# Patient Record
Sex: Male | Born: 1955 | Race: White | Hispanic: No | Marital: Married | Smoking: Former smoker
Health system: Southern US, Community
[De-identification: ages and names within clinical notes are randomized; demographics above are authoritative.]

## PROBLEM LIST (undated history)

## (undated) DIAGNOSIS — K625 Hemorrhage of anus and rectum: Secondary | ICD-10-CM

## (undated) DIAGNOSIS — K469 Unspecified abdominal hernia without obstruction or gangrene: Secondary | ICD-10-CM

## (undated) DIAGNOSIS — K589 Irritable bowel syndrome without diarrhea: Secondary | ICD-10-CM

## (undated) DIAGNOSIS — I2699 Other pulmonary embolism without acute cor pulmonale: Secondary | ICD-10-CM

## (undated) HISTORY — PX: HERNIA REPAIR: SHX51

## (undated) HISTORY — DX: Hemorrhage of anus and rectum: K62.5

## (undated) HISTORY — DX: Unspecified abdominal hernia without obstruction or gangrene: K46.9

---

## 2010-11-15 ENCOUNTER — Ambulatory Visit (INDEPENDENT_AMBULATORY_CARE_PROVIDER_SITE_OTHER): Payer: Self-pay | Admitting: General Surgery

## 2010-12-07 ENCOUNTER — Ambulatory Visit (INDEPENDENT_AMBULATORY_CARE_PROVIDER_SITE_OTHER): Payer: PRIVATE HEALTH INSURANCE | Admitting: General Surgery

## 2010-12-07 ENCOUNTER — Encounter (INDEPENDENT_AMBULATORY_CARE_PROVIDER_SITE_OTHER): Payer: Self-pay | Admitting: General Surgery

## 2010-12-07 VITALS — BP 126/80 | HR 60 | Temp 97.4°F | Ht 70.0 in | Wt 187.8 lb

## 2010-12-07 DIAGNOSIS — K4091 Unilateral inguinal hernia, without obstruction or gangrene, recurrent: Secondary | ICD-10-CM

## 2010-12-07 NOTE — Progress Notes (Signed)
Ian West is a 55 y.o. male.    Chief Complaint  Patient presents with  . Other    eval of lih    HPI HPI This patient is referred Dr. freed for evaluation of a left groin bulge which he noticed approximately 9 months ago. He has a history of a right inguinal hernia which he states was repaired approximately 20 years ago due to incarceration. He has done well from this procedure but several months ago noticed some swelling on the left side. He denies any pain in the region although he describes it as "an annoyance". He states that he has this discomfort and swelling when he is on his feet for a prolonged period of time. He states his bowels are normal has no associated nausea vomiting no associated symptoms and denies any obstructive symptoms.He does report having a colonoscopy which is up-to-date which was without significant abnormality.  Past Medical History  Diagnosis Date  . Hernia   . Rectal bleeding     hemorrhoids    History reviewed. No pertinent past surgical history.  Family History  Problem Relation Age of Onset  . Thyroid disease Mother   . Diabetes Father     Social History History  Substance Use Topics  . Smoking status: Former Smoker    Quit date: 08/11/1993  . Smokeless tobacco: Not on file  . Alcohol Use: Yes     2 drinks per week    Allergies  Allergen Reactions  . Pollen Extract-Tree Extract Itching    Seasonal allergy. Includes grass.    No current outpatient prescriptions on file.    Review of Systems Review of Systems  Constitutional: Negative.   HENT: Negative.   Eyes: Negative.   Respiratory: Negative.   Cardiovascular: Negative.   Gastrointestinal: Positive for blood in stool.  Genitourinary: Negative.   Musculoskeletal: Negative.   Skin: Negative.   Neurological: Negative.   Endo/Heme/Allergies: Negative.   Psychiatric/Behavioral: Negative.     Physical Exam Physical Exam  Vitals reviewed. Constitutional: He is oriented to  person, place, and time. He appears well-developed and well-nourished. No distress.  HENT:  Head: Normocephalic and atraumatic.  Mouth/Throat: No oropharyngeal exudate.  Eyes: Conjunctivae and EOM are normal. Pupils are equal, round, and reactive to light. Right eye exhibits no discharge. Left eye exhibits no discharge. No scleral icterus.  Neck: No tracheal deviation present.  Cardiovascular: Normal rate, regular rhythm and normal heart sounds.   Respiratory: Effort normal and breath sounds normal. No stridor. No respiratory distress. He has no wheezes.  GI: Soft. Bowel sounds are normal. He exhibits no distension and no mass. There is no tenderness. There is no rebound and no guarding.  Genitourinary:       Reducible LIH, whss on the right  Musculoskeletal: Normal range of motion. He exhibits no tenderness.  Neurological: He is alert and oriented to person, place, and time.  Skin: Skin is warm and dry. No rash noted. He is not diaphoretic. No erythema. No pallor.  Psychiatric: He has a normal mood and affect. His behavior is normal. Judgment and thought content normal.     Blood pressure 126/80, pulse 60, temperature 97.4 F (36.3 C), temperature source Temporal, height 5\' 10"  (1.778 m), weight 187 lb 12.8 oz (85.186 kg).  Assessment/Plan He does have a moderate-sized, reducible, left inguinal hernia on exam and by history. I do not appreciate any recurrent right inguinal hernia. He is minimally symptomatic from this but I would recommend repair  on elective basis as he is having some symptoms and it is likely to increase in size. Also given his history of incarceration on the contralateral side I think it it better to perform elective repair. I discussed with him the options of laparoscopic versus an open hernia repair and he is considering both. He is concerned about the finances and the cost of each repair and he is leaning towards open repair if this will be most cost effective for him. We  will plan for open repair and if he changes his mind we will be happy to offer him laparoscopic left inguinal hernia repair. I discussed with him the risks of infection, bleeding, pain, scarring, nerve injury and chronic pain, loss of testicle, and injury to the vas deferens and he expressed understanding and desires to proceed with left inguinal hernia repair  Ian West Ian West 12/07/2010, 1:24 PM

## 2016-04-25 ENCOUNTER — Emergency Department (HOSPITAL_COMMUNITY): Payer: BLUE CROSS/BLUE SHIELD

## 2016-04-25 ENCOUNTER — Inpatient Hospital Stay (HOSPITAL_COMMUNITY)
Admission: EM | Admit: 2016-04-25 | Discharge: 2016-04-28 | DRG: 175 | Disposition: A | Discharge status: Home / Self Care | Payer: BLUE CROSS/BLUE SHIELD | Attending: Internal Medicine | Admitting: Internal Medicine

## 2016-04-25 ENCOUNTER — Encounter (HOSPITAL_COMMUNITY): Payer: Self-pay

## 2016-04-25 ENCOUNTER — Inpatient Hospital Stay (HOSPITAL_COMMUNITY): Payer: BLUE CROSS/BLUE SHIELD

## 2016-04-25 DIAGNOSIS — R578 Other shock: Secondary | ICD-10-CM | POA: Diagnosis present

## 2016-04-25 DIAGNOSIS — R52 Pain, unspecified: Secondary | ICD-10-CM

## 2016-04-25 DIAGNOSIS — I2699 Other pulmonary embolism without acute cor pulmonale: Secondary | ICD-10-CM | POA: Diagnosis not present

## 2016-04-25 DIAGNOSIS — J9601 Acute respiratory failure with hypoxia: Secondary | ICD-10-CM | POA: Diagnosis present

## 2016-04-25 DIAGNOSIS — Z87891 Personal history of nicotine dependence: Secondary | ICD-10-CM

## 2016-04-25 DIAGNOSIS — R570 Cardiogenic shock: Secondary | ICD-10-CM | POA: Diagnosis not present

## 2016-04-25 DIAGNOSIS — I82419 Acute embolism and thrombosis of unspecified femoral vein: Secondary | ICD-10-CM | POA: Diagnosis not present

## 2016-04-25 DIAGNOSIS — R0602 Shortness of breath: Secondary | ICD-10-CM | POA: Diagnosis present

## 2016-04-25 DIAGNOSIS — I2609 Other pulmonary embolism with acute cor pulmonale: Secondary | ICD-10-CM

## 2016-04-25 DIAGNOSIS — I82431 Acute embolism and thrombosis of right popliteal vein: Secondary | ICD-10-CM | POA: Diagnosis present

## 2016-04-25 LAB — CBG MONITORING, ED: GLUCOSE-CAPILLARY: 153 mg/dL — AB (ref 65–99)

## 2016-04-25 LAB — PROTIME-INR
INR: 1.92
Prothrombin Time: 22.2 seconds — ABNORMAL HIGH (ref 11.4–15.2)

## 2016-04-25 LAB — CBC
HCT: 24.7 % — ABNORMAL LOW (ref 39.0–52.0)
HEMATOCRIT: 37.3 % — AB (ref 39.0–52.0)
HEMOGLOBIN: 8.4 g/dL — AB (ref 13.0–17.0)
HEMOGLOBIN: 13 g/dL (ref 13.0–17.0)
MCH: 32.4 pg (ref 26.0–34.0)
MCH: 32.5 pg (ref 26.0–34.0)
MCHC: 34 g/dL (ref 30.0–36.0)
MCHC: 34.9 g/dL (ref 30.0–36.0)
MCV: 95.4 fL (ref 78.0–100.0)
MCV: 93.3 fL (ref 78.0–100.0)
PLATELETS: 91 10*3/uL — AB (ref 150–400)
Platelets: 191 10*3/uL (ref 150–400)
RBC: 2.59 MIL/uL — ABNORMAL LOW (ref 4.22–5.81)
RBC: 4 MIL/uL — AB (ref 4.22–5.81)
RDW: 12.4 % (ref 11.5–15.5)
RDW: 12.6 % (ref 11.5–15.5)
WBC: 12 10*3/uL — ABNORMAL HIGH (ref 4.0–10.5)
WBC: 10.2 10*3/uL (ref 4.0–10.5)

## 2016-04-25 LAB — CBC WITH DIFFERENTIAL/PLATELET
BASOS ABS: 0 10*3/uL (ref 0.0–0.1)
BASOS PCT: 0 %
Eosinophils Absolute: 0.1 10*3/uL (ref 0.0–0.7)
Eosinophils Relative: 1 %
HEMATOCRIT: 42.4 % (ref 39.0–52.0)
HEMOGLOBIN: 14.9 g/dL (ref 13.0–17.0)
LYMPHS PCT: 12 %
Lymphs Abs: 1.5 10*3/uL (ref 0.7–4.0)
MCH: 32.7 pg (ref 26.0–34.0)
MCHC: 35.1 g/dL (ref 30.0–36.0)
MCV: 93.2 fL (ref 78.0–100.0)
MONOS PCT: 8 %
Monocytes Absolute: 1 10*3/uL (ref 0.1–1.0)
NEUTROS ABS: 10.6 10*3/uL — AB (ref 1.7–7.7)
NEUTROS PCT: 79 %
Platelets: 168 10*3/uL (ref 150–400)
RBC: 4.55 MIL/uL (ref 4.22–5.81)
RDW: 12.2 % (ref 11.5–15.5)
WBC: 13.2 10*3/uL — ABNORMAL HIGH (ref 4.0–10.5)

## 2016-04-25 LAB — I-STAT TROPONIN, ED: Troponin i, poc: 0.21 ng/mL (ref 0.00–0.08)

## 2016-04-25 LAB — APTT: APTT: 72 s — AB (ref 24–36)

## 2016-04-25 LAB — BASIC METABOLIC PANEL
ANION GAP: 7 (ref 5–15)
BUN: 14 mg/dL (ref 6–20)
CHLORIDE: 108 mmol/L (ref 101–111)
CO2: 23 mmol/L (ref 22–32)
Calcium: 9 mg/dL (ref 8.9–10.3)
Creatinine, Ser: 1.14 mg/dL (ref 0.61–1.24)
GFR calc Af Amer: >60 mL/min (ref >60)
GFR calc non Af Amer: >60 mL/min (ref >60)
GLUCOSE: 114 mg/dL — AB (ref 65–99)
Potassium: 4.8 mmol/L (ref 3.5–5.1)
Sodium: 138 mmol/L (ref 135–145)

## 2016-04-25 LAB — HEPARIN LEVEL (UNFRACTIONATED)

## 2016-04-25 LAB — MRSA PCR SCREENING: MRSA BY PCR: NEGATIVE

## 2016-04-25 MED ORDER — SODIUM CHLORIDE 0.9 % IV SOLN: 250.0000 mL | INTRAVENOUS | Status: DC | PRN

## 2016-04-25 MED ORDER — IOPAMIDOL (ISOVUE-370) INJECTION 76%
INTRAVENOUS | Status: AC
  Administered 2016-04-25: 100 mL
  Filled 2016-04-25: qty 100

## 2016-04-25 MED ORDER — NOREPINEPHRINE BITARTRATE 1 MG/ML IV SOLN
2.0000 ug/min | INTRAVENOUS | Status: DC
  Administered 2016-04-25: 8 ug/min via INTRAVENOUS
  Filled 2016-04-25: qty 4

## 2016-04-25 MED ORDER — HEPARIN (PORCINE) IN NACL 100-0.45 UNIT/ML-% IJ SOLN
1450.0000 [IU]/h | INTRAMUSCULAR | Status: AC
  Administered 2016-04-25: 1300 [IU]/h via INTRAVENOUS
  Administered 2016-04-26: 1700 [IU]/h via INTRAVENOUS
  Administered 2016-04-27: 1450 [IU]/h via INTRAVENOUS
  Filled 2016-04-25 (×5): qty 250

## 2016-04-25 MED ORDER — SODIUM CHLORIDE 0.9 % IV SOLN
250.0000 mL | Freq: Once | INTRAVENOUS | Status: AC
  Administered 2016-04-25: 250 mL via INTRAVENOUS

## 2016-04-25 MED ORDER — TENECTEPLASE 50 MG IV KIT
50.0000 mg | PACK | INTRAVENOUS | Status: AC
  Administered 2016-04-25: 50 mg via INTRAVENOUS
  Filled 2016-04-25: qty 10

## 2016-04-25 MED ORDER — SODIUM CHLORIDE 0.9 % IV SOLN
INTRAVENOUS | Status: DC
  Administered 2016-04-25 – 2016-04-27 (×2): via INTRAVENOUS

## 2016-04-25 MED ORDER — ALTEPLASE (PULMONARY EMBOLISM) INFUSION
100.0000 mg | Freq: Once | INTRAVENOUS | Status: DC
  Filled 2016-04-25: qty 100

## 2016-04-25 NOTE — ED Notes (Signed)
Critical Care NP at bedside pt c/o headache that started a few minutes ago will continue to monitor

## 2016-04-25 NOTE — Progress Notes (Addendum)
ANTICOAGULATION CONSULT NOTE - Initial Consult  Pharmacy Consult for TNKase >> Heparin Indication: pulmonary embolus  Allergies  Allergen Reactions  . Pollen Extract-Tree Extract Itching    Seasonal allergy. Includes grass.    Patient Measurements: Height: 5\' 10"  (177.8 cm) Weight: 205 lb (93 kg) IBW/kg (Calculated) : 73 Heparin Dosing Weight: 91.8 kg  Vital Signs: Temp: 98.4 F (36.9 C) (01/01 1135) Temp Source: Oral (01/01 1135) BP: 133/85 (01/01 1500) Pulse Rate: 100 (01/01 1500)  Labs:  Recent Labs  04/25/16 1155 04/25/16 1437  HGB 14.9 8.4*  HCT 42.4 24.7*  PLT 168 PENDING  APTT  --  72*  LABPROT  --  22.2*  INR  --  1.92  CREATININE 1.14  --     Estimated Creatinine Clearance: 79.9 mL/min (by C-G formula based on SCr of 1.14 mg/dL).   Medical History: Past Medical History:  Diagnosis Date  . Hernia   . Rectal bleeding    hemorrhoids    Assessment: 61 yo male with PE s/p TNKase in ED given at ~1350. Pharmacy consulted to dose heparin WITHOUT bolus. Not on any anticoagulants prior to admission. Hgb/Hct and pltc wnl upon admission.  Hgb with large drop ~15>8.4 after TNKase. No bleeding per RN after transfer to floor.   First APTT <80, INR 1.92, drawn at ~1430 04/25/2016.   Goal of Therapy:  Goal heparin level 0.3-0.5 for first 24 hrs, then 0.3-0.7  Monitor platelets by anticoagulation protocol: Yes   Plan:  Start heparin infusion at 1300 units/hr WITHOUT BOLUS Check heparin level q6h for the first 24 hrs (through 04/26/16 @ 1400 ), then daily  Continue to monitor Hgb, hct, pltc 2030 heparin level  Allena Katzaroline E Welles, Pharm.D. PGY1 Pharmacy Resident 1/1/20183:28 PM Pager [phone]

## 2016-04-25 NOTE — ED Notes (Addendum)
While in pt's room pt on monitor began to have pauses and was noted to drop his BP pt also demonstrated some shaking to upper extremities and color change became very pale and diaphoretic RT to room ER MD to room critical care team paged pt remains diaphoretic and deep inspirations noted

## 2016-04-25 NOTE — ED Notes (Signed)
Went in to pt's room found pt unresponsive with agonal respirations, pt on monitor in NST 120's ER MD and RT to room

## 2016-04-25 NOTE — ED Notes (Signed)
Critical care NP at bedside pt with agonal breathing appears in distress color still appears bluish

## 2016-04-25 NOTE — Progress Notes (Signed)
*  PRELIMINARY RESULTS* Vascular Ultrasound Lower extremity venous duplex has been completed.  Preliminary findings: Hyperacute DVT noted in the right popliteal vein. The vein appears largely dilated with partial thrombus and partial rouleaux flow. Short segments of acute DVT noted in the right distal femoral vein and proximal posterior tibial veins.   Called results to Dr. Delton CoombesByrum.   Farrel DemarkJill Eunice, RDMS, RVT  04/25/2016, 3:23 PM

## 2016-04-25 NOTE — ED Triage Notes (Signed)
Pt over the past few weeks has had some intermittent chest pain related to a cough than last night woke up from sleep with pain in his R calf area then this morning when he took his leg off he pillow pt experienced sudden onset of sharp chest pain with diaphoresis initial BP was in the 80's pt now arrives with decreased chest pain and appears in no distress [place]

## 2016-04-25 NOTE — ED Notes (Signed)
Pt remains on monitor is responsive to verbal commands pads placed on pt's chest 5L [place] remains on pt has IVF infusing will continue to monitor

## 2016-04-25 NOTE — ED Notes (Addendum)
Levo 8mc started pt remains on monitor still appears in resp distress color appears cyanotic 100% NRB started by RT

## 2016-04-25 NOTE — ED Notes (Signed)
Byrum MD at bedside pushing TNK to L IV site

## 2016-04-25 NOTE — ED Notes (Signed)
Family at bedside. Pt remains on monitor still responding to verbal stimuli BP improving

## 2016-04-25 NOTE — ED Notes (Signed)
Pt now responding to verbal stimulation appears pale and diaphoretic pt remains on monitor 5L [place] ER MD and family at bedside

## 2016-04-25 NOTE — ED Provider Notes (Signed)
[facility]-EMERGENCY DEPT Provider Note   CSN: [id number] Arrival date & time: 04/25/16  1124     History   Chief Complaint Chief Complaint  Patient presents with  . Shortness of Breath    pt had an earlier episode of chest pain after moving his R leg after having pain that woke him up from his sleep     HPI Ian West is a 61 y.o. male.  HPI Ian West is a 61 y.o. male with PMH significant for hernia and hemorrhoids who presents BIB EMS on 5L oxygen via nasal cannula satting about 95% with 4-5 day history of gradual onset, constant, worsening shortness of breath.  He states he is normally an active guy but over the last couple of days he has become extremely short of breath performing daily activities such as walking up stairs, walking in general, and exercising.  Associated symptoms include mild non-productive cough.  He denies fever, chills, chest pain, syncope, hemoptysis, N/V/D.  This morning he awoke and felt like he was not able to breathe and became diaphoretic.  He also experienced severe right calf pain.  He denies swelling, erythema, or warmth.  No numbness, pallor, or weakness. He did recently fly from [facility] about 4 weeks ago.  No hx of DVT/PE, no family hx of DVT/PE, recent surgery, or immobilization.    Past Medical History:  Diagnosis Date  . Hernia   . Rectal bleeding    hemorrhoids    Patient Active Problem List   Diagnosis Date Noted  . PE (pulmonary thromboembolism) (HCC) 04/25/2016    Past Surgical History:  Procedure Laterality Date  . HERNIA REPAIR         Home Medications    Prior to Admission medications   Medication Sig Start Date End Date Taking? Authorizing Provider  acetaminophen (TYLENOL) 500 MG tablet Take 1,000 mg by mouth every 6 (six) hours as needed for moderate pain.   Yes Historical Provider, MD  acidophilus (RISAQUAD) CAPS capsule Take 1 capsule by mouth daily.   Yes Historical Provider, MD  polycarbophil (FIBERCON) 625 MG tablet Take  625 mg by mouth daily.   Yes Historical Provider, MD  Turmeric (CURCUMIN 95) 500 MG CAPS Take 500 mg by mouth daily.   Yes Historical Provider, MD    Family History Family History  Problem Relation Age of Onset  . Thyroid disease Mother   . Diabetes Father     Social History Social History  Substance Use Topics  . Smoking status: Former Smoker    Quit date: 08/11/1993  . Smokeless tobacco: Never Used  . Alcohol use Yes     Comment: 2 drinks per week     Allergies   Pollen extract-tree extract   Review of Systems Review of Systems All other systems negative unless otherwise stated in HPI   Physical Exam Updated Vital Signs BP 133/85   Pulse 100   Temp 98.4 F (36.9 C) (Oral)   Resp 16   Ht 5\' 10"  (1.778 m)   Wt 93 kg   SpO2 98%   BMI 29.41 kg/m   Physical Exam  Constitutional: He is oriented to person, place, and time. He appears well-developed and well-nourished.  Non-toxic appearance. He does not have a sickly appearance. He does not appear ill.  HENT:  Head: Normocephalic and atraumatic.  Mouth/Throat: Oropharynx is clear and moist.  Eyes: Conjunctivae are normal.  Neck: Normal range of motion. Neck supple.  Cardiovascular: Regular rhythm.  Tachycardia  present.   HR 112 on monitor  Pulmonary/Chest: Effort normal. No accessory muscle usage or stridor. No respiratory distress. He has wheezes (faint). He has rhonchi. He has no rales.  Patient on 5L [place] with satts ranging 93-97%.  When oxygen removed desatts to 90%.  Able to speak in full sentences, but seems short of breath.   Abdominal: Soft. Bowel sounds are normal. He exhibits no distension. There is no tenderness.  Musculoskeletal: Normal range of motion. He exhibits tenderness.  Right proximal calf with tenderness.  No erythema, warmth, or palpable cords. Negative Homan's sign.   Lymphadenopathy:    He has no cervical adenopathy.  Neurological: He is alert and oriented to person, place, and time.  Speech  clear without dysarthria.  Skin: Skin is warm and dry.  Psychiatric: He has a normal mood and affect. His behavior is normal.     ED Treatments / Results  Labs (all labs ordered are listed, but only abnormal results are displayed) Labs Reviewed  CBC WITH DIFFERENTIAL/PLATELET - Abnormal; Notable for the following:       Result Value   WBC 13.2 (*)    Neutro Abs 10.6 (*)    All other components within normal limits  BASIC METABOLIC PANEL - Abnormal; Notable for the following:    Glucose, Bld 114 (*)    All other components within normal limits  APTT - Abnormal; Notable for the following:    aPTT 72 (*)    All other components within normal limits  PROTIME-INR - Abnormal; Notable for the following:    Prothrombin Time 22.2 (*)    All other components within normal limits  CBC - Abnormal; Notable for the following:    WBC 12.0 (*)    RBC 2.59 (*)    Hemoglobin 8.4 (*)    HCT 24.7 (*)    All other components within normal limits  I-STAT TROPOININ, ED - Abnormal; Notable for the following:    Troponin i, poc 0.21 (*)    All other components within normal limits  CBG MONITORING, ED - Abnormal; Notable for the following:    Glucose-Capillary 153 (*)    All other components within normal limits  MRSA PCR SCREENING  HEPARIN LEVEL (UNFRACTIONATED)    EKG  EKG Interpretation  Date/Time:  Monday April 25 2016 11:31:39 EST Ventricular Rate:  107 PR Interval:    QRS Duration: 99 QT Interval:  350 QTC Calculation: 467 R Axis:   87 Text Interpretation:  Sinus tachycardia RSR' in V1 or V2, right VCD or RVH Confirmed by BELFI  MD, MELANIE ([id number]) on 04/25/2016 12:21:34 PM       Radiology Dg Chest 2 View  Result Date: 04/25/2016 CLINICAL DATA:  Pt c/o SOB, central chest discomfort and tightness, with coldness and clamminess this morning. Pt states he also had pain in the back of his right leg earlier today. Pt reports occasional dry cough for 4-5 days. EXAM: CHEST  2 VIEW  COMPARISON:  None. FINDINGS: The heart size and mediastinal contours are within normal limits. Both lungs are clear. No pleural effusion or pneumothorax. The visualized skeletal structures are unremarkable. IMPRESSION: No active cardiopulmonary disease. Electronically Signed   By: Amie Portland M.D.   On: 04/25/2016 12:38   Ct Angio Chest Pe W/cm &/or Wo Cm  Result Date: 04/25/2016 CLINICAL DATA:  Chest pain, leg pain. EXAM: CT ANGIOGRAPHY CHEST WITH CONTRAST TECHNIQUE: Multidetector CT imaging of the chest was performed using the standard protocol during  bolus administration of intravenous contrast. Multiplanar CT image reconstructions and MIPs were obtained to evaluate the vascular anatomy. CONTRAST:  100 mL of Isovue 370 intravenously. COMPARISON:  Chest radiograph of same day. FINDINGS: Cardiovascular: There is no evidence of thoracic aortic dissection or aneurysm. Large bilateral pulmonary emboli are noted in the upper and lower lobe branches. RV/LV ratio of 1.5 is noted suggesting right heart strain. Mediastinum/Nodes: No enlarged mediastinal, hilar, or axillary lymph nodes. Thyroid gland, trachea, and esophagus demonstrate no significant findings. Lungs/Pleura: Lungs are clear. No pleural effusion or pneumothorax. Upper Abdomen: No acute abnormality. Musculoskeletal: No chest wall abnormality. No acute or significant osseous findings. Review of the MIP images confirms the above findings. IMPRESSION: Large bilateral pulmonary emboli are noted. Positive for acute PE with CT evidence of right heart strain (RV/LV Ratio = 1.5) consistent with at least submassive (intermediate risk) PE. The presence of right heart strain has been associated with an increased risk of morbidity and mortality. Please activate Code PE by paging [phone]. Critical Value/emergent results were called by telephone at the time of interpretation on 04/25/2016 at 1:09 pm to Dr. Dorathy DaftKAYLA Tavonna Worthington , who verbally acknowledged these results.  Electronically Signed   By: Lupita RaiderJames  Green Jr, M.D.   On: 04/25/2016 13:09    Procedures Procedures (including critical care time)  CRITICAL CARE Performed by: Cheri FowlerKayla Naya Ilagan   Total critical care time: 45 minutes  Critical care time was exclusive of separately billable procedures and treating other patients.  Critical care was necessary to treat or prevent imminent or life-threatening deterioration.  Critical care was time spent personally by me on the following activities: development of treatment plan with patient and/or surrogate as well as nursing, discussions with consultants, evaluation of patient's response to treatment, examination of patient, obtaining history from patient or surrogate, ordering and performing treatments and interventions, ordering and review of laboratory studies, ordering and review of radiographic studies, pulse oximetry and re-evaluation of patient's condition.   Medications Ordered in ED Medications  0.9 %  sodium chloride infusion ( Intravenous New Bag/Given 04/25/16 1346)  0.9 %  sodium chloride infusion (not administered)  norepinephrine (LEVOPHED) 4 mg in dextrose 5 % 250 mL (0.016 mg/mL) infusion (0 mcg/min Intravenous Stopped 04/25/16 1420)  heparin ADULT infusion 100 units/mL (25000 units/25450mL sodium chloride 0.45%) (not administered)  iopamidol (ISOVUE-370) 76 % injection (100 mLs  Contrast Given 04/25/16 1238)  0.9 %  sodium chloride infusion (0 mLs Intravenous Stopped 04/25/16 1403)  tenecteplase (TNKASE) injection 50 mg (50 mg Intravenous Given 04/25/16 1351)     Initial Impression / Assessment and Plan / ED Course  I have reviewed the triage vital signs and the nursing notes.  Pertinent labs & imaging results that were available during my care of the patient were reviewed by me and considered in my medical decision making (see chart for details).  Clinical Course    Patient presents with shortness of breath interfering with daily activities.  On  arrival, he is tachycardic and hypoxic to about 90% on RA which resolves with oxygen via nasal cannula. Lungs with some wheezing and rhonchi.  RLE TTP at proximal posterior calf.  Negative Homan's.  Concern for PE.  Work up initiated. CT shows large bilateral PEs with right heart strain which is likely causing elevated troponin at 0.21.  CC consulted and prior to their evaluation patient became unresponsive with agonal respirations and had signs of shock with hypotension and tachycardia.  CC evaluated the patient and upon pharmacy  arrival patient received TNkase.  Patient began to improve with this and Levophed and he was admitted to ICU for further management and workup.   Final Clinical Impressions(s) / ED Diagnoses   Final diagnoses:  Other acute pulmonary embolism with acute cor pulmonale [facility])    New Prescriptions Current Discharge Medication List       Cheri Fowler, PA-C 04/25/16 1529    Rolan Bucco, MD 04/25/16 1554

## 2016-04-25 NOTE — H&P (Signed)
PULMONARY / CRITICAL CARE MEDICINE   Name: Ian West MRN: [id number] DOB: [birth year]    ADMISSION DATE:  04/25/2016 CONSULTATION DATE:  1/1  REFERRING MD:  Hermine Messick  CHIEF COMPLAINT:  Acute PE, obstructive shock   HISTORY OF PRESENT ILLNESS:   61 year old white male w/out sig medical history. Recently traveled to [facility] in December. Presents to ER w/ ~4-5 d h/o mild shortness of breath. Then the AM of 1/1 woke up with pain in the right calf area. He was elevating his leg and then developed sudden onset of sharp chest pain and became diaphoretic. EMS ws called. Initially In ER SBP in 80s, O2 sats were low and required 5 liters just to achieve 88%. He initially seemed to feel a little better then had stood to void and had sudden syncopal episode w/ associated agonal respirations and was unresponsive. This initially resolved spontaneously but then he became suddenly more diaphoretic, developed acute respiratory distress. SBP went in 70s and he was having increased discomfort. PCCM to bedside emergently. He was administered IV crystalloid bolus, levophed gtt started, placed on 100% NRB and TNKASE was pushed at 1340. He will be admitted to the ICU for further monitoring and care  PAST MEDICAL HISTORY :  He  has a past medical history of Hernia and Rectal bleeding.  PAST SURGICAL HISTORY: He  has a past surgical history that includes Hernia repair.  Allergies  Allergen Reactions  . Pollen Extract-Tree Extract Itching    Seasonal allergy. Includes grass.    No current facility-administered medications on file prior to encounter.    No current outpatient prescriptions on file prior to encounter.    FAMILY HISTORY:  His indicated that his mother is alive. He indicated that his father is deceased. He indicated that his sister is alive. He indicated that his brother is alive.    SOCIAL HISTORY: He  reports that he quit smoking about 22 years ago. He has never used smokeless tobacco. He  reports that he drinks alcohol. He reports that he does not use drugs.  REVIEW OF SYSTEMS:   + chest pain +shortness of breath +mild dull HA No other pert +  SUBJECTIVE:  ++ resp distress   VITAL SIGNS: BP 97/74   Pulse 102   Temp 98.4 F (36.9 C) (Oral)   Resp 26   Ht 5' 10"  (1.778 m)   Wt 205 lb (93 kg)   SpO2 99%   BMI 29.41 kg/m   HEMODYNAMICS:    VENTILATOR SETTINGS: FiO2 (%):  [100 %] 100 %  INTAKE / OUTPUT: No intake/output data recorded.  PHYSICAL EXAMINATION: General:  Well developed 61 year old wm, now on 100% NRB. Slowly feeling better  Neuro:  Awake, oriented, no focal def  HEENT:  NCAT, no JVD  Cardiovascular:  RRR, no MRG Lungs:  Clear + accessory use  Abdomen: soft not tender  Musculoskeletal:  Equal st and bulk  Skin:  Warm diaphoretic   LABS:  BMET  Recent Labs Lab 04/25/16 1155  NA 138  K 4.8  CL 108  CO2 23  BUN 14  CREATININE 1.14  GLUCOSE 114*    Electrolytes  Recent Labs Lab 04/25/16 1155  CALCIUM 9.0    CBC  Recent Labs Lab 04/25/16 1155  WBC 13.2*  HGB 14.9  HCT 42.4  PLT 168    Coag's No results for input(s): APTT, INR in the last 168 hours.  Sepsis Markers No results for input(s):  LATICACIDVEN, PROCALCITON, O2SATVEN in the last 168 hours.  ABG No results for input(s): PHART, PCO2ART, PO2ART in the last 168 hours.  Liver Enzymes No results for input(s): AST, ALT, ALKPHOS, BILITOT, ALBUMIN in the last 168 hours.  Cardiac Enzymes No results for input(s): TROPONINI, PROBNP in the last 168 hours.  Glucose  Recent Labs Lab 04/25/16 1313  GLUCAP 153*    Imaging Dg Chest 2 View  Result Date: 04/25/2016 CLINICAL DATA:  Pt c/o SOB, central chest discomfort and tightness, with coldness and clamminess this morning. Pt states he also had pain in the back of his right leg earlier today. Pt reports occasional dry cough for 4-5 days. EXAM: CHEST  2 VIEW COMPARISON:  None. FINDINGS: The heart size and  mediastinal contours are within normal limits. Both lungs are clear. No pleural effusion or pneumothorax. The visualized skeletal structures are unremarkable. IMPRESSION: No active cardiopulmonary disease. Electronically Signed   By: Lajean Manes M.D.   On: 04/25/2016 12:38   Ct Angio Chest Pe W/cm &/or Wo Cm  Result Date: 04/25/2016 CLINICAL DATA:  Chest pain, leg pain. EXAM: CT ANGIOGRAPHY CHEST WITH CONTRAST TECHNIQUE: Multidetector CT imaging of the chest was performed using the standard protocol during bolus administration of intravenous contrast. Multiplanar CT image reconstructions and MIPs were obtained to evaluate the vascular anatomy. CONTRAST:  100 mL of Isovue 370 intravenously. COMPARISON:  Chest radiograph of same day. FINDINGS: Cardiovascular: There is no evidence of thoracic aortic dissection or aneurysm. Large bilateral pulmonary emboli are noted in the upper and lower lobe branches. RV/LV ratio of 1.5 is noted suggesting right heart strain. Mediastinum/Nodes: No enlarged mediastinal, hilar, or axillary lymph nodes. Thyroid gland, trachea, and esophagus demonstrate no significant findings. Lungs/Pleura: Lungs are clear. No pleural effusion or pneumothorax. Upper Abdomen: No acute abnormality. Musculoskeletal: No chest wall abnormality. No acute or significant osseous findings. Review of the MIP images confirms the above findings. IMPRESSION: Large bilateral pulmonary emboli are noted. Positive for acute PE with CT evidence of right heart strain (RV/LV Ratio = 1.5) consistent with at least submassive (intermediate risk) PE. The presence of right heart strain has been associated with an increased risk of morbidity and mortality. Please activate Code PE by paging [phone]. Critical Value/emergent results were called by telephone at the time of interpretation on 04/25/2016 at 1:09 pm to Dr. Lonn Georgia ROSE , who verbally acknowledged these results. Electronically Signed   By: Marijo Conception, M.D.    On: 04/25/2016 13:09     STUDIES:  CT chest (CTA) 1/1: Large bilateral pulmonary emboli are noted. Positive for acute PE with CT evidence of right heart strain (RV/LV Ratio = 1.5) consistent with at least submassive (intermediate risk) PE  CULTURES:   ANTIBIOTICS:   SIGNIFICANT EVENTS: 1/1 @ 1340: given 43m  TNKASE  LINES/TUBES:  DISCUSSION: 89yom who presents w/ massive PE and associated acute hypoxic respiratory failure and obstructive shock. Got TNKASE. Clinically improving here in ER Admit to ICU Heparin to start once APTT w/in range ECHO LE ultrasound Tele Wean levo for MAP >65.  ASSESSMENT / PLAN:  PULMONARY A: Massive bilateral pulmonary Emboli  Acute Hypoxic Respiratory Failure d/t PE ->s/p TNKASE 1/1  P:   Admit to ICU Supplemental oxygen TNKASE protocol w/ heparin gtt to start based on APTT See heme section   CARDIOVASCULAR A:  Obstructive Shock 2/2 PE  P:  Volume challenge Levophed for MAP >65 Tele  Ck ECHO  RENAL A:   No  acute  P:   F/u intermittent chemistry   GASTROINTESTINAL A:   No acute  P:   Sup w PPI given recent TNKASE  HEMATOLOGIC A:   Acute Pulmonary Emboli seemingly provoked by travel  P:  Hep gtt to start based on APTT Trend CBC  Get LE [place]  INFECTIOUS A:   No acute  P:   Trend fever WBC curve   ENDOCRINE A:   No acute   P:   Trend fasting glucose   NEUROLOGIC A:   No acute  P:   Serial MS assessment   FAMILY  - Updates:   - Inter-disciplinary family meet or Palliative Care meeting due by:  1/7  Erick Colace ACNP-BC [place] Pager # [phone] OR # [phone] if no answer 04/25/2016, 1:54 PM  Attending Note:  I have examined patient, reviewed labs, studies and notes. I have discussed the case with Jerrye Bushy, and I agree with the data and plans as amended above. 61 yo man with little PMH, admitted with subacute dyspnea progressive over 5 days. In ED CT scan showed acute large  B PE w R heart strain. He was intially hypoxemic, hemodynamically stable but then he suffered a syncopal episode, developed hypotension. On my initial eval he was pale, tachypneic, hypotensive to 70 systolic, uncomfortable. Lungs clear. Was able to wake and respond to questions. Based on his hypoxemia and progressive hemodynamic instability I felt that he met criteria for systemic lysis. I spoke with his wife and son and there were no known contraindications uncovered. TNKas was pushed around 13:50. He will be moved to ICU, start heparin when his aPTT is < 80. We will ramp up his support if indicated - hopefully will be able to wean pressors, avoid intubation. Independent critical care time is 50 minutes.   Baltazar Apo, MD, PhD 04/25/2016, 2:53 PM [facility] [phone] or if no answer [phone]

## 2016-04-25 NOTE — Progress Notes (Signed)
ANTICOAGULATION CONSULT NOTE - Follow-up Consult  Pharmacy Consult for TNKase >> Heparin Indication: pulmonary embolus and DVT  Allergies  Allergen Reactions  . Pollen Extract-Tree Extract Itching    Seasonal allergy. Includes grass.    Patient Measurements: Height: 5\' 10"  (177.8 cm) Weight: 210 lb 5.1 oz (95.4 kg) IBW/kg (Calculated) : 73 Heparin Dosing Weight: 91.8 kg  Vital Signs: Temp: 98.8 F (37.1 C) (01/01 1900) Temp Source: Oral (01/01 1900) BP: 128/75 (01/01 2200) Pulse Rate: 87 (01/01 2200)  Labs:  Recent Labs  04/25/16 1155 04/25/16 1437 04/25/16 2125 04/25/16 2231  HGB 14.9 8.4*  --  13.0  HCT 42.4 24.7*  --  37.3*  PLT 168 91*  --  191  APTT  --  72*  --   --   LABPROT  --  22.2*  --   --   INR  --  1.92  --   --   HEPARINUNFRC  --   --  <0.10*  --   CREATININE 1.14  --   --   --     Estimated Creatinine Clearance: 80.9 mL/min (by C-G formula based on SCr of 1.14 mg/dL).   Assessment: 61 yo male with PE s/p TNKase in ED given at ~1350. Pharmacy consulted to dose heparin WITHOUT bolus. Not on any anticoagulants prior to admission. Hgb/Hct and pltc wnl upon admission. No bleeding per RN after transfer to floor. First APTT <80, INR 1.92, drawn at ~1430.   Heparin level undetectable on gtt at 1300 units/hr. No issues with line or bleeding reported per RN. Hgb initially with large drop ~15>8.4 after TNKase, but repeat Hgb back up to 13 (so 8.4 maybe an error).  Goal of Therapy:  Goal heparin level 0.3-0.5 for first 24 hrs, then 0.3-0.7 units/ml Monitor platelets by anticoagulation protocol: Yes   Plan:  Increase heparin infusion to 1700 units/hr  Will f/u 6 hr heparin level  Christoper Fabianaron Nahlia Hellmann, PharmD, BCPS Clinical pharmacist, pager [phone] 1/1/201810:56 PM

## 2016-04-25 NOTE — ED Notes (Signed)
ER MD and RT remain at bedside pt remains responsive to verbal stimuli Levo started at 8mcg as per [facility]

## 2016-04-25 NOTE — ED Notes (Signed)
Spoke with pharmacy they are mixing TpA for pt, pt remains responsive to verbal stimuli pt has bluish discoloration noted

## 2016-04-26 ENCOUNTER — Inpatient Hospital Stay (HOSPITAL_COMMUNITY): Payer: BLUE CROSS/BLUE SHIELD

## 2016-04-26 DIAGNOSIS — I2699 Other pulmonary embolism without acute cor pulmonale: Secondary | ICD-10-CM

## 2016-04-26 LAB — CBC
HCT: 36.9 % — ABNORMAL LOW (ref 39.0–52.0)
Hemoglobin: 12.6 g/dL — ABNORMAL LOW (ref 13.0–17.0)
MCH: 32 pg (ref 26.0–34.0)
MCHC: 34.1 g/dL (ref 30.0–36.0)
MCV: 93.7 fL (ref 78.0–100.0)
PLATELETS: 173 10*3/uL (ref 150–400)
RBC: 3.94 MIL/uL — ABNORMAL LOW (ref 4.22–5.81)
RDW: 12.7 % (ref 11.5–15.5)
WBC: 10.3 10*3/uL (ref 4.0–10.5)

## 2016-04-26 LAB — HEPARIN LEVEL (UNFRACTIONATED)
Heparin Unfractionated: 0.63 IU/mL (ref 0.30–0.70)
Heparin Unfractionated: 0.69 IU/mL (ref 0.30–0.70)
Heparin Unfractionated: 0.45 IU/mL (ref 0.30–0.70)

## 2016-04-26 LAB — ECHOCARDIOGRAM COMPLETE
Height: 70 in
WEIGHTICAEL: 3350.99 [oz_av]

## 2016-04-26 NOTE — Progress Notes (Signed)
ANTICOAGULATION CONSULT NOTE - Follow-up Consult  Pharmacy Consult for TNKase >> Heparin Indication: pulmonary embolus and DVT  Allergies  Allergen Reactions  . Pollen Extract-Tree Extract Itching    Seasonal allergy. Includes grass.    Patient Measurements: Height: 5\' 10"  (177.8 cm) Weight: 209 lb 7 oz (95 kg) IBW/kg (Calculated) : 73 Heparin Dosing Weight: 91.8 kg  Vital Signs: Temp: 98 F (36.7 C) (01/02 0847) Temp Source: Oral (01/02 0847) BP: 117/65 (01/02 0800) Pulse Rate: 73 (01/02 0800)  Labs:  Recent Labs  04/25/16 1155 04/25/16 1437 04/25/16 2125 04/25/16 2231 04/26/16 0417 04/26/16 0650  HGB 14.9 8.4*  --  13.0 12.6*  --   HCT 42.4 24.7*  --  37.3* 36.9*  --   PLT 168 91*  --  191 173  --   APTT  --  72*  --   --   --   --   LABPROT  --  22.2*  --   --   --   --   INR  --  1.92  --   --   --   --   HEPARINUNFRC  --   --  <0.10*  --   --  0.63  CREATININE 1.14  --   --   --   --   --     Estimated Creatinine Clearance: 80.7 mL/min (by C-G formula based on SCr of 1.14 mg/dL).   Assessment: 61 yo male with PE s/p TNKase in ED given at ~1350 on 1/1. Not on any anticoagulants prior to admission. Hgb/Hct and pltc wnl upon admission.  -HL supratherapeutic at 0.63 at 1700 units/hr -Hgb 12.6, Plts 173  -No bleeding noted  Goal of Therapy:  Goal heparin level 0.3-0.5 for first 24 hrs, then 0.3-0.7 units/ml Monitor platelets by anticoagulation protocol: Yes   Plan:  Decrease heparin infusion to 1600 units/hr  Will f/u 6 hr heparin level Check heparin level q6h for the first 24 hrs (through 04/26/16 @ 1400 ), then daily   Gwyndolyn KaufmanKai Chavon Lucarelli Bernette Redbird(Kenny), PharmD  PGY1 Pharmacy Resident Pager: [phone] 04/26/2016 8:59 AM

## 2016-04-26 NOTE — H&P (Signed)
PULMONARY / CRITICAL CARE MEDICINE   Name: Ian West MRN: [id number] DOB: 28-Jun-1955    ADMISSION DATE:  04/25/2016 CONSULTATION DATE:  1/1  REFERRING MD:  Angeline Slim  CHIEF COMPLAINT:  Acute PE, obstructive shock   HISTORY OF PRESENT ILLNESS:   61 year old white male w/out sig medical history. Recently traveled to [facility] in December. Presents to ER w/ ~4-5 d h/o mild shortness of breath. Then the AM of 1/1 woke up with pain in the right calf area. He was elevating his leg and then developed sudden onset of sharp chest pain and became diaphoretic. EMS ws called. Initially In ER SBP in 80s, O2 sats were low and required 5 liters just to achieve 88%. He initially seemed to feel a little better then had stood to void and had sudden syncopal episode w/ associated agonal respirations and was unresponsive. This initially resolved spontaneously but then he became suddenly more diaphoretic, developed acute respiratory distress. SBP went in 70s and he was having increased discomfort. PCCM to bedside emergently. He was administered IV crystalloid bolus, levophed gtt started, placed on 100% NRB and TNKASE was pushed at 1340. He will be admitted to the ICU for further monitoring and care  SUBJECTIVE:  Significant improvement No dyspnea, pressors off, no HA  VITAL SIGNS: BP 123/75 (BP Location: Left Arm)   Pulse 79   Temp 97.8 F (36.6 C) (Oral)   Resp 15   Ht 5\' 10"  (1.778 m)   Wt 95 kg (209 lb 7 oz)   SpO2 96%   BMI 30.05 kg/m   HEMODYNAMICS:    VENTILATOR SETTINGS:    INTAKE / OUTPUT: I/O last 3 completed shifts: In: 1328.5 [I.V.:1328.5] Out: 1225 [Urine:1225]  PHYSICAL EXAMINATION: General:  Well developed 61 year old wm, on  O2 Neuro:  Awake, oriented, no focal def  HEENT:  NCAT, no JVD  Cardiovascular:  RRR, no MRG Lungs:  Clear  Abdomen: soft not tender  Musculoskeletal:  Equal st and bulk  Skin:  Warm diaphoretic   LABS:  BMET  Recent Labs Lab 04/25/16 1155  NA  138  K 4.8  CL 108  CO2 23  BUN 14  CREATININE 1.14  GLUCOSE 114*    Electrolytes  Recent Labs Lab 04/25/16 1155  CALCIUM 9.0    CBC  Recent Labs Lab 04/25/16 1437 04/25/16 2231 04/26/16 0417  WBC 12.0* 10.2 10.3  HGB 8.4* 13.0 12.6*  HCT 24.7* 37.3* 36.9*  PLT 91* 191 173    Coag's  Recent Labs Lab 04/25/16 1437  APTT 72*  INR 1.92    Sepsis Markers No results for input(s): LATICACIDVEN, PROCALCITON, O2SATVEN in the last 168 hours.  ABG No results for input(s): PHART, PCO2ART, PO2ART in the last 168 hours.  Liver Enzymes No results for input(s): AST, ALT, ALKPHOS, BILITOT, ALBUMIN in the last 168 hours.  Cardiac Enzymes No results for input(s): TROPONINI, PROBNP in the last 168 hours.  Glucose  Recent Labs Lab 04/25/16 1313  GLUCAP 153*    Imaging No results found.   STUDIES:  CT chest (CTA) 1/1: Large bilateral pulmonary emboli are noted. Positive for acute PE with CT evidence of right heart strain (RV/LV Ratio = 1.5) consistent with at least submassive (intermediate risk) PE Doppler US LE 1/1 >> positive for R popliteal DVT  CULTURES:   ANTIBIOTICS:   SIGNIFICANT EVENTS: 1/1 @ 1340: given 50mg   TNKASE  LINES/TUBES:  DISCUSSION: 45 yom who presents w/ massive PE  and associated acute hypoxic respiratory failure and obstructive shock. Got TNKASE. Clinically improved   ASSESSMENT / PLAN:  PULMONARY A: Massive bilateral pulmonary Emboli  Acute Hypoxic Respiratory Failure d/t PE ->s/p TNKASE 1/1  P:   Supplemental oxygen TNKASE protocol w/ heparin gtt to start based on APTT See heme section  LE DVT noted To oral anticoag therapy 1/3  CARDIOVASCULAR A:  Obstructive Shock 2/2 PE  P:  Resolved Check TTE to eval r heart fxn. Likely repeat in 6 months.   RENAL A:   No acute  P:   F/u intermittent chemistry   GASTROINTESTINAL A:   No acute  P:   Sup w PPI given recent TNKASE  HEMATOLOGIC A:   Acute Pulmonary  Emboli seemingly provoked by travel  R Popliteal DVT  P:  Hep gtt  Trend CBC   INFECTIOUS A:   No acute  P:   Trend fever WBC curve   ENDOCRINE A:   No acute   P:   Trend fasting glucose   NEUROLOGIC A:   No acute  P:   Serial MS assessment   FAMILY  - Updates:   Transfer [facility] floor bed 1/2, [facility].   Levy Pupaobert Byrum, MD, PhD 04/26/2016, 2:23 PM [facility] [phone] or if no answer [phone]

## 2016-04-26 NOTE — Progress Notes (Signed)
  Echocardiogram 2D Echocardiogram has been performed.  Arvil ChacoFoster, Nollan Muldrow 04/26/2016, 1:03 PM

## 2016-04-26 NOTE — Progress Notes (Signed)
ANTICOAGULATION CONSULT NOTE - Follow Up Consult  Pharmacy Consult for heparin Indication: pulmonary embolus and DVT  Labs:  Recent Labs  04/25/16 1155 04/25/16 1437  04/25/16 2231 04/26/16 0417 04/26/16 0650 04/26/16 1542 04/26/16 2253  HGB 14.9 8.4*  --  13.0 12.6*  --   --   --   HCT 42.4 24.7*  --  37.3* 36.9*  --   --   --   PLT 168 91*  --  191 173  --   --   --   APTT  --  72*  --   --   --   --   --   --   LABPROT  --  22.2*  --   --   --   --   --   --   INR  --  1.92  --   --   --   --   --   --   HEPARINUNFRC  --   --   < >  --   --  0.63 0.69 0.45  CREATININE 1.14  --   --   --   --   --   --   --   < > = values in this interval not displayed.   Assessment/Plan:  61yo male therapeutic on heparin after rate change. Will continue gtt at current rate and confirm stable with am labs.   Vernard GamblesVeronda Okema Rollinson, PharmD, BCPS  04/26/2016,11:56 PM

## 2016-04-26 NOTE — Progress Notes (Signed)
ANTICOAGULATION CONSULT NOTE - Follow-up Consult  Pharmacy Consult for TNKase >> Heparin Indication: pulmonary embolus and DVT  Allergies  Allergen Reactions  . Pollen Extract-Tree Extract Itching    Seasonal allergy. Includes grass.    Patient Measurements: Height: 5\' 10"  (177.8 cm) Weight: 209 lb 7 oz (95 kg) IBW/kg (Calculated) : 73 Heparin Dosing Weight: 91.8 kg  Vital Signs: Temp: 98.6 F (37 C) (01/02 1558) Temp Source: Oral (01/02 1558) BP: 124/71 (01/02 1600) Pulse Rate: 79 (01/02 1600)  Labs:  Recent Labs  04/25/16 1155 04/25/16 1437 04/25/16 2125 04/25/16 2231 04/26/16 0417 04/26/16 0650 04/26/16 1542  HGB 14.9 8.4*  --  13.0 12.6*  --   --   HCT 42.4 24.7*  --  37.3* 36.9*  --   --   PLT 168 91*  --  191 173  --   --   APTT  --  72*  --   --   --   --   --   LABPROT  --  22.2*  --   --   --   --   --   INR  --  1.92  --   --   --   --   --   HEPARINUNFRC  --   --  <0.10*  --   --  0.63 0.69  CREATININE 1.14  --   --   --   --   --   --     Estimated Creatinine Clearance: 80.7 mL/min (by C-G formula based on SCr of 1.14 mg/dL).   Assessment: 61 yo male with PE s/p TNKase in ED given at ~1350 on 1/1. Not on any anticoagulants prior to admission.   Heparin level therapeutic (now outside of 24hrs so range 0.3 to 0.7) however appears to be accumulating.  CBC is stable. No bleeding. Discussed therapy with patient. Will decrease some and recheck a level this PM to confirm.   Goal of Therapy:  Heparin level 0.3-0.7 units/ml (now outside of 24hrs since TNKase) Monitor platelets by anticoagulation protocol: Yes   Plan:  Decrease heparin infusion to 1450 units/hr  Recheck heparin level in  6 hr Monitor for signs and symptoms of bleeding.   Link SnufferJessica Rielynn Trulson, PharmD, BCPS Clinical Pharmacist [phone] until 11 PM tonight [phone] after hours 04/26/2016 4:28 PM

## 2016-04-27 DIAGNOSIS — I82419 Acute embolism and thrombosis of unspecified femoral vein: Secondary | ICD-10-CM

## 2016-04-27 LAB — CBC
HCT: 37.9 % — ABNORMAL LOW (ref 39.0–52.0)
Hemoglobin: 12.9 g/dL — ABNORMAL LOW (ref 13.0–17.0)
MCH: 31.6 pg (ref 26.0–34.0)
MCHC: 34 g/dL (ref 30.0–36.0)
MCV: 92.9 fL (ref 78.0–100.0)
PLATELETS: 197 10*3/uL (ref 150–400)
RBC: 4.08 MIL/uL — ABNORMAL LOW (ref 4.22–5.81)
RDW: 12.1 % (ref 11.5–15.5)
WBC: 7.9 10*3/uL (ref 4.0–10.5)

## 2016-04-27 LAB — HEPARIN LEVEL (UNFRACTIONATED): Heparin Unfractionated: 0.46 IU/mL (ref 0.30–0.70)

## 2016-04-27 MED ORDER — RIVAROXABAN 15 MG PO TABS
15.0000 mg | ORAL_TABLET | Freq: Two times a day (BID) | ORAL | Status: DC
  Administered 2016-04-27 – 2016-04-28 (×2): 15 mg via ORAL
  Filled 2016-04-27 (×2): qty 1

## 2016-04-27 MED ORDER — RIVAROXABAN 20 MG PO TABS: 20.0000 mg | ORAL_TABLET | Freq: Every day | ORAL | Status: DC

## 2016-04-27 MED ORDER — ACETAMINOPHEN 325 MG PO TABS: 650.0000 mg | ORAL_TABLET | ORAL | Status: DC | PRN

## 2016-04-27 NOTE — Progress Notes (Addendum)
ANTICOAGULATION CONSULT NOTE - Follow Up Consult  Pharmacy Consult for heparin Indication: pulmonary embolus and DVT  Allergies  Allergen Reactions  . Pollen Extract-Tree Extract Itching    Seasonal allergy. Includes grass.    Patient Measurements: Height: 5\' 10"  (177.8 cm) Weight: 202 lb 3.2 oz (91.7 kg) IBW/kg (Calculated) : 73 Heparin Dosing Weight:   Vital Signs: Temp: 98.3 F (36.8 C) (01/03 0538) Temp Source: Oral (01/03 0538) BP: 121/70 (01/03 0538) Pulse Rate: 74 (01/03 0538)  Labs:  Recent Labs  04/25/16 1155 04/25/16 1437  04/25/16 2231 04/26/16 0417  04/26/16 1542 04/26/16 2253 04/27/16 0648  HGB 14.9 8.4*  --  13.0 12.6*  --   --   --  12.9*  HCT 42.4 24.7*  --  37.3* 36.9*  --   --   --  37.9*  PLT 168 91*  --  191 173  --   --   --  197  APTT  --  72*  --   --   --   --   --   --   --   LABPROT  --  22.2*  --   --   --   --   --   --   --   INR  --  1.92  --   --   --   --   --   --   --   HEPARINUNFRC  --   --   < >  --   --   < > 0.69 0.45 0.46  CREATININE 1.14  --   --   --   --   --   --   --   --   < > = values in this interval not displayed.  Estimated Creatinine Clearance: 79.4 mL/min (by C-G formula based on SCr of 1.14 mg/dL).   Medications:  Scheduled:   Assessment: 61yo male with PE, s/p TNKase in ED on 1/1 at 1350.  Heparin level is therapeutic and CBC is stable.  No bleeding noted.  He will be able to start oral anticoagulation this afternoon, > 48hr s/p TNKase.  Currently awaiting information on coverage to determine Xarelto vs Eliquis.  Goal of Therapy:  Heparin level 0.3-0.7 Monitor platelets by anticoagulation protocol: Yes   Plan:  Continue heparin 1450 units/hr Daily HL, CBC Continue to monitor Hgb, hct, pltc OKto transition to oral AC after 48 hours (after 2PM on 1/3) Xarelto vs Eliquis Rx per Clydie BraunrRai once coverage determined   Marisue HumbleKendra Ismar Yabut, PharmD Clinical Pharmacist [facility]   04/27/16  Pharmacy- Xarelto Tomi BambergerBrenda Graves-Bigelow RN-CM notified me that Xarelto is the cheaper medication for this patient, so to start Xarelto per discussion with Dr Isidoro Donningai earlier today.  Pt needs to be > 48hr [facility] to oral anticoagulation, so will start this evening.  1-  Xarelto 15mg  po bid x 42 doses, then 20mg  qday 2-  D/C heparin with first dose of Xarelto 3-  D/C all heparin labs 4-  Watch for s/s of bleeding  Marisue HumbleKendra Brittley Regner, PharmD Clinical Pharmacist [facility] 

## 2016-04-27 NOTE — Progress Notes (Signed)
[facility]                                                                              Patient Demographics  Ian West, is a 61 y.o. male, DOB - [birth year], UEA:[id number]  Admit date - 04/25/2016   Admitting Physician Leslye Peer, MD  Outpatient Primary MD for the patient is FRIED, Ian Cheadle, MD  Outpatient specialists:   LOS - 2  days    Chief Complaint  Patient presents with  . Shortness of Breath    pt had an earlier episode of chest pain after moving his R leg after having pain that woke him up from his sleep        Brief summary   61 year old white male With no significant medical history who recently traveled to [facility] in December. He presented to ER with 4-5 days history of short mild shortness of breath. He woke up in the morning of 1/1 with pain in the right calf area. He was elevating his leg and then developed sudden onset of sharp chest pain and became diaphoretic. EMS was called. Initially In ER SBP was in 80s, O2 sats were low and required 5 liters just to achieve 88%. He initially seemed to feel a little better then had stood to void and had sudden syncopal episode w/ associated agonal respirations and was unresponsive. This initially resolved spontaneously but then he became suddenly more diaphoretic, developed acute respiratory distress. SBP went in 70s and he was having increased discomfort. PCCM was consulted in the ED and patient was placed on vasopressors, Levothroid, 100% NRB and TNKASE was pushed. He will be admitted to the ICU for further monitoring    Assessment & Plan    Acute Massive bilateral pulmonary Emboli with Acute Hypoxic Respiratory Failure d/t PE s/p TNKASE 1/1  - Lower extremity DVT - Doppler ultrasound of the lower extremity showed acute DVT in the right popliteal vein - Patient was placed on heparin drip - Risk and benefits explained to the patient and wife at the bedside regarding lovenox/Coumadin versus NOACs.  Patient preferred NOACS. Case management consult obtained, started on xarelto today  Obstructive Shock secondary to pulmonary embolism and right heart strain - 2-D echo showed EF of 60-65% with mild pulmonary hypertension  Code Status: full code  DVT Prophylaxis:  heparin  Family Communication: Discussed in detail with the patient, all imaging results, lab results explained to the patient and wife    Disposition Plan: Likely tomorrow  Time Spent in minutes   15 minutes  Procedures:  2-D echo, CT urogram of the chest  Consultants:   Pulmonology  Antimicrobials:      Medications  Scheduled Meds: . Rivaroxaban  15 mg Oral BID WC  . [START ON 05/19/2016] rivaroxaban  20 mg Oral Daily   Continuous Infusions: . sodium chloride 50 mL/hr at 04/27/16 0330  . heparin 1,450 Units/hr (04/27/16 0121)   PRN Meds:.sodium chloride   Antibiotics   Anti-infectives    None        Subjective:   [name] was seen and examined today.  Patient denies  dizziness, chest pain, shortness of breath, abdominal pain, N/V/D/C, new weakness, numbess, tingling. No acute events overnight.    Objective:   Vitals:   04/26/16 1928 04/26/16 2000 04/26/16 2100 04/27/16 0538  BP:  115/70 138/72 121/70  Pulse:  78 72 74  Resp:  16  18  Temp: 99.2 F (37.3 C)  98.1 F (36.7 C) 98.3 F (36.8 C)  TempSrc: Oral  Oral Oral  SpO2:  98% 96% 95%  Weight:    91.7 kg (202 lb 3.2 oz)  Height:        Intake/Output Summary (Last 24 hours) at 04/27/16 1428 Last data filed at 04/27/16 0940  Gross per 24 hour  Intake          1332.48 ml  Output              625 ml  Net           707.48 ml     Wt Readings from Last 3 Encounters:  04/27/16 91.7 kg (202 lb 3.2 oz)  12/07/10 85.2 kg (187 lb 12.8 oz)     Exam  General: Alert and oriented x 3, NAD  HEENT:  PERRLA, EOMI, Anicteric Sclera, mucous membranes moist.   Neck: Supple, no JVD, no masses  Cardiovascular: S1 S2 auscultated, no  rubs, murmurs or gallops. Regular rate and rhythm.  Respiratory: Clear to auscultation bilaterally, no wheezing, rales or rhonchi  Gastrointestinal: Soft, nontender, nondistended, + bowel sounds  Ext: no cyanosis clubbing or edema  Neuro: AAOx3, Cr N's II- XII. Strength 5/5 upper and lower extremities bilaterally  Skin: No rashes  Psych: Normal affect and demeanor, alert and oriented x3    Data Reviewed:  I have personally reviewed following labs and imaging studies  Micro Results Recent Results (from the past 240 hour(s))  MRSA PCR Screening     Status: None   Collection Time: 04/25/16  2:42 PM  Result Value Ref Range Status   MRSA by PCR NEGATIVE NEGATIVE Final    Comment:        The GeneXpert MRSA Assay (FDA approved for NASAL specimens only), is one component of a comprehensive MRSA colonization surveillance program. It is not intended to diagnose MRSA infection nor to guide or monitor treatment for MRSA infections.     Radiology Reports Dg Chest 2 View  Result Date: 04/25/2016 CLINICAL DATA:  Pt c/o SOB, central chest discomfort and tightness, with coldness and clamminess this morning. Pt states he also had pain in the back of his right leg earlier today. Pt reports occasional dry cough for 4-5 days. EXAM: CHEST  2 VIEW COMPARISON:  None. FINDINGS: The heart size and mediastinal contours are within normal limits. Both lungs are clear. No pleural effusion or pneumothorax. The visualized skeletal structures are unremarkable. IMPRESSION: No active cardiopulmonary disease. Electronically Signed   By: Amie Portlandavid  Ormond M.D.   On: 04/25/2016 12:38   Ct Angio Chest Pe W/cm &/or Wo Cm  Result Date: 04/25/2016 CLINICAL DATA:  Chest pain, leg pain. EXAM: CT ANGIOGRAPHY CHEST WITH CONTRAST TECHNIQUE: Multidetector CT imaging of the chest was performed using the standard protocol during bolus administration of intravenous contrast. Multiplanar CT image reconstructions and MIPs were  obtained to evaluate the vascular anatomy. CONTRAST:  100 mL of Isovue 370 intravenously. COMPARISON:  Chest radiograph of same day. FINDINGS: Cardiovascular: There is no evidence of thoracic aortic dissection or aneurysm. Large bilateral pulmonary emboli are noted in the upper and lower lobe branches.  RV/LV ratio of 1.5 is noted suggesting right heart strain. Mediastinum/Nodes: No enlarged mediastinal, hilar, or axillary lymph nodes. Thyroid gland, trachea, and esophagus demonstrate no significant findings. Lungs/Pleura: Lungs are clear. No pleural effusion or pneumothorax. Upper Abdomen: No acute abnormality. Musculoskeletal: No chest wall abnormality. No acute or significant osseous findings. Review of the MIP images confirms the above findings. IMPRESSION: Large bilateral pulmonary emboli are noted. Positive for acute PE with CT evidence of right heart strain (RV/LV Ratio = 1.5) consistent with at least submassive (intermediate risk) PE. The presence of right heart strain has been associated with an increased risk of morbidity and mortality. Please activate Code PE by paging [phone]. Critical Value/emergent results were called by telephone at the time of interpretation on 04/25/2016 at 1:09 pm to Dr. Dorathy Daft ROSE , who verbally acknowledged these results. Electronically Signed   By: Lupita Raider, M.D.   On: 04/25/2016 13:09    Lab Data:  CBC:  Recent Labs Lab 04/25/16 1155 04/25/16 1437 04/25/16 2231 04/26/16 0417 04/27/16 0648  WBC 13.2* 12.0* 10.2 10.3 7.9  NEUTROABS 10.6*  --   --   --   --   HGB 14.9 8.4* 13.0 12.6* 12.9*  HCT 42.4 24.7* 37.3* 36.9* 37.9*  MCV 93.2 95.4 93.3 93.7 92.9  PLT 168 91* 191 173 197   Basic Metabolic Panel:  Recent Labs Lab 04/25/16 1155  NA 138  K 4.8  CL 108  CO2 23  GLUCOSE 114*  BUN 14  CREATININE 1.14  CALCIUM 9.0   GFR: Estimated Creatinine Clearance: 79.4 mL/min (by C-G formula based on SCr of 1.14 mg/dL). Liver Function Tests: No  results for input(s): AST, ALT, ALKPHOS, BILITOT, PROT, ALBUMIN in the last 168 hours. No results for input(s): LIPASE, AMYLASE in the last 168 hours. No results for input(s): AMMONIA in the last 168 hours. Coagulation Profile:  Recent Labs Lab 04/25/16 1437  INR 1.92   Cardiac Enzymes: No results for input(s): CKTOTAL, CKMB, CKMBINDEX, TROPONINI in the last 168 hours. BNP (last 3 results) No results for input(s): PROBNP in the last 8760 hours. HbA1C: No results for input(s): HGBA1C in the last 72 hours. CBG:  Recent Labs Lab 04/25/16 1313  GLUCAP 153*   Lipid Profile: No results for input(s): CHOL, HDL, LDLCALC, TRIG, CHOLHDL, LDLDIRECT in the last 72 hours. Thyroid Function Tests: No results for input(s): TSH, T4TOTAL, FREET4, T3FREE, THYROIDAB in the last 72 hours. Anemia Panel: No results for input(s): VITAMINB12, FOLATE, FERRITIN, TIBC, IRON, RETICCTPCT in the last 72 hours. Urine analysis: No results found for: COLORURINE, APPEARANCEUR, LABSPEC, PHURINE, GLUCOSEU, HGBUR, BILIRUBINUR, KETONESUR, PROTEINUR, UROBILINOGEN, NITRITE, Hurshel Party M.D. [facility] 04/27/2016, 2:28 PM  Pager: [phone] Between 7am to 7pm - call Pager - [phone]  After 7pm go to [email or web address] - password [email or web address]  Call night coverage person covering after 7pm

## 2016-04-27 NOTE — Care Management Note (Signed)
Case Management Note  Patient Details  Name: Algis LimingMark Perkey MRN: [id number] Date of Birth: 1955/05/29  Subjective/Objective:  Pt presented for Pulmonary Embolism. Pt is from home with the support of his wife. Plan will be to return home on Xarelto once stable for d/c. CM will provide pt with 30 day free Xarelto Card and Co pay Card. [facility] and medication is not available in the starter pack. [facility] did call [facility] [address] to see if available and they have in stock. No further needs from CM at this time.               Action/Plan: S/W TABITHA @ [employer] [place] [facility] # [phone]   1.XARELTO 15 MG BID FOR 21 DAYS 42 TAB  COVER- YES  CO-PAY- $ 20.00  TIER- 3 DRUG  PRIOR APPROVAL- NO   2 XARELTO 20 MG DAILY 30 /30 TAB  COVER- YES  CO-PAY- $ 20.00  TIER- 3 DRUG  PRIOR APPROVAL- NO   3. ELIQUIS 2.5 MG BID  30 /60 TAB  COVER- YES  CO-PAY- $ 80.00  TIER- 4 DRUG  PRIOR APPROVAL- NO   4. ELIQUIS 5 MG BID 30 /60 TAB  COVER- YES  CO-PAY- $ 80.00  TIER- 4 DRUG  PRIOR APPROVAL- NO   PHARMACY [facility]   Expected Discharge Date:                  Expected Discharge Plan:  Home/Self Care  In-House Referral:  NA  Discharge planning Services  CM Consult, Medication Assistance  Post Acute Care Choice:  NA Choice offered to:  NA  DME Arranged:  N/A DME Agency:  NA  HH Arranged:  NA HH Agency:  NA  Status of Service:  Completed, signed off  If discussed at Long Length of Stay Meetings, dates discussed:    Additional Comments:  Gala LewandowskyGraves-Bigelow, Ryanne Morand Kaye, RN 04/27/2016, 12:37 PM

## 2016-04-27 NOTE — Discharge Instructions (Addendum)
Information on my medicine - XARELTO (rivaroxaban)  This medication education was reviewed with me or my healthcare representative as part of my discharge preparation.  The pharmacist that spoke with me during my hospital stay was:  Renaee MundaHiatt, Kendra P, [facility]  WHY WAS XARELTO PRESCRIBED FOR YOU? Xarelto was prescribed to treat blood clots that may have been found in the veins of your legs (deep vein thrombosis) or in your lungs (pulmonary embolism) and to reduce the risk of them occurring again.  What do you need to know about Xarelto? The starting dose is one 15 mg tablet taken TWICE daily with food for the FIRST 21 DAYS then on 05/19/16  the dose is changed to one 20 mg tablet taken ONCE A DAY with your evening meal.  DO NOT stop taking Xarelto without talking to the health care provider who prescribed the medication.  Refill your prescription for 20 mg tablets before you run out.  After discharge, you should have regular check-up appointments with your healthcare provider that is prescribing your Xarelto.  In the future your dose may need to be changed if your kidney function changes by a significant amount.  What do you do if you miss a dose? If you are taking Xarelto TWICE DAILY and you miss a dose, take it as soon as you remember. You may take two 15 mg tablets (total 30 mg) at the same time then resume your regularly scheduled 15 mg twice daily the next day.  If you are taking Xarelto ONCE DAILY and you miss a dose, take it as soon as you remember on the same day then continue your regularly scheduled once daily regimen the next day. Do not take two doses of Xarelto at the same time.   Important Safety Information Xarelto is a blood thinner medicine that can cause bleeding. You should call your healthcare provider right away if you experience any of the following: ? Bleeding from an injury or your nose that does not stop. ? Unusual colored urine (red or dark brown) or unusual colored  stools (red or black). ? Unusual bruising for unknown reasons. ? A serious fall or if you hit your head (even if there is no bleeding).  Some medicines may interact with Xarelto and might increase your risk of bleeding while on Xarelto. To help avoid this, consult your healthcare provider or pharmacist prior to using any new prescription or non-prescription medications, including herbals, vitamins, non-steroidal anti-inflammatory drugs (NSAIDs) and supplements.  This website has more information on Xarelto: [email or web address].

## 2016-04-28 DIAGNOSIS — I82431 Acute embolism and thrombosis of right popliteal vein: Secondary | ICD-10-CM

## 2016-04-28 MED ORDER — RIVAROXABAN (XARELTO) VTE STARTER PACK (15 & 20 MG): ORAL_TABLET | ORAL | 0 refills | Status: DC

## 2016-04-28 MED ORDER — RIVAROXABAN 20 MG PO TABS: 20.0000 mg | ORAL_TABLET | Freq: Every day | ORAL | 3 refills | Status: DC

## 2016-04-28 NOTE — Discharge Summary (Signed)
Physician Discharge Summary   Patient ID: Ian West MRN: [id number] DOB/AGE: [birth year] 61 y.o.  Admit date: 04/25/2016 Discharge date: 04/28/2016  Primary Care Physician:  Lenora Boys, MD  Discharge Diagnoses:    . Acute massive bilateral PE (pulmonary thromboembolism) (HCC)    Acute hypoxic respiratory failure due to pulmonary embolism Acute right popliteal vein DVT   Consults:  Patient was admitted by Pulmonary critical care   Recommendations for Outpatient Follow-up:  1. Patient will likely need a repeat 2-D echo in 3 months due to right heart strain seen on 2-D echo   DIET: Heart healthy diet    Allergies:   Allergies  Allergen Reactions  . Pollen Extract-Tree Extract Itching    Seasonal allergy. Includes grass.     DISCHARGE MEDICATIONS: Current Discharge Medication List    START taking these medications   Details  rivaroxaban (XARELTO) 20 MG TABS tablet Take 1 tablet (20 mg total) by mouth daily with supper. Start this prescription after the starter pack is complete Qty: 30 tablet, Refills: 3    Rivaroxaban 15 & 20 MG TBPK Take as directed on package: Start with one 15mg  tablet by mouth twice a day with food. On Day 22, 05/19/16, switch to one 20mg  tablet once a day with food. Qty: 51 each, Refills: 0      CONTINUE these medications which have NOT CHANGED   Details  acetaminophen (TYLENOL) 500 MG tablet Take 1,000 mg by mouth every 6 (six) hours as needed for moderate pain.    acidophilus (RISAQUAD) CAPS capsule Take 1 capsule by mouth daily.    polycarbophil (FIBERCON) 625 MG tablet Take 625 mg by mouth daily.    Turmeric (CURCUMIN 95) 500 MG CAPS Take 500 mg by mouth daily.         Brief H and P: For complete details please refer to admission H and P, but in brief 61 year old white male With no significant medical history who recently traveled to [facility] in December. He presented to ER with 4-5 days history of short mild shortness of breath.  He woke up in the morning of 1/1 with pain in the right calf area. He was elevating his leg and then developed sudden onset of sharp chest pain and became diaphoretic. EMS was called. Initially In ER SBP was in 80s, O2 sats were low and required 5 liters just to achieve 88%. He initially seemed to feel a little better then had stood to void and had sudden syncopal episode w/ associated agonal respirations and was unresponsive. This initially resolved spontaneously but then he became suddenly more diaphoretic, developed acute respiratory distress. SBP went in 70s and he was having increased discomfort. PCCM was consulted in the ED and patient was placed on vasopressors, Levothroid, 100% NRB and TNKASE was pushed. He was admitted to the ICU for further monitoring.  Hospital Course:  Acute Massive bilateral pulmonary Emboli with Acute Hypoxic Respiratory Failure d/t PE s/p TNKASE 1/1  Patient was admitted to intensive care unit after TNKase protocol  - Doppler ultrasound of the lower extremity showed acute DVT in the right popliteal vein - Patient was placed on IV heparin drip - Risk and benefits explained to the patient and wife at the bedside regarding lovenox/Coumadin versus NOACs. Patient preferred NOACS. Case management consult obtained, started on xarelto - Patient tolerated xarelto well, outpatient pulmonology follow-up was scheduled with Dr. Delton Coombes on 05/24/16.   Obstructive Shock secondary to pulmonary embolism and right heart  strain - 2-D echo showed EF of 60-65% with mild pulmonary hypertension. Recommend repeating 2-D echo in 3 months    Day of Discharge BP 107/62 (BP Location: Left Arm)   Pulse 69   Temp 98.1 F (36.7 C) (Oral)   Resp 16   Ht 5\' 10"  (1.778 m)   Wt 92.5 kg (203 lb 14.4 oz)   SpO2 96%   BMI 29.26 kg/m   Physical Exam: General: Alert and awake oriented x3 not in any acute distress. HEENT: anicteric sclera, pupils reactive to light and accommodation CVS: S1-S2  clear no murmur rubs or gallops Chest: clear to auscultation bilaterally, no wheezing rales or rhonchi Abdomen: soft nontender, nondistended, normal bowel sounds Extremities: no cyanosis, clubbing or edema noted bilaterally Neuro: Cranial nerves II-XII intact, no focal neurological deficits   The results of significant diagnostics from this hospitalization (including imaging, microbiology, ancillary and laboratory) are listed below for reference.    LAB RESULTS: Basic Metabolic Panel:  Recent Labs Lab 04/25/16 1155  NA 138  K 4.8  CL 108  CO2 23  GLUCOSE 114*  BUN 14  CREATININE 1.14  CALCIUM 9.0   Liver Function Tests: No results for input(s): AST, ALT, ALKPHOS, BILITOT, PROT, ALBUMIN in the last 168 hours. No results for input(s): LIPASE, AMYLASE in the last 168 hours. No results for input(s): AMMONIA in the last 168 hours. CBC:  Recent Labs Lab 04/25/16 1155  04/26/16 0417 04/27/16 0648  WBC 13.2*  < > 10.3 7.9  NEUTROABS 10.6*  --   --   --   HGB 14.9  < > 12.6* 12.9*  HCT 42.4  < > 36.9* 37.9*  MCV 93.2  < > 93.7 92.9  PLT 168  < > 173 197  < > = values in this interval not displayed. Cardiac Enzymes: No results for input(s): CKTOTAL, CKMB, CKMBINDEX, TROPONINI in the last 168 hours. BNP: Invalid input(s): POCBNP CBG:  Recent Labs Lab 04/25/16 1313  GLUCAP 153*    Significant Diagnostic Studies:  Dg Chest 2 View  Result Date: 04/25/2016 CLINICAL DATA:  Pt c/o SOB, central chest discomfort and tightness, with coldness and clamminess this morning. Pt states he also had pain in the back of his right leg earlier today. Pt reports occasional dry cough for 4-5 days. EXAM: CHEST  2 VIEW COMPARISON:  None. FINDINGS: The heart size and mediastinal contours are within normal limits. Both lungs are clear. No pleural effusion or pneumothorax. The visualized skeletal structures are unremarkable. IMPRESSION: No active cardiopulmonary disease. Electronically Signed   By:  Amie Portland M.D.   On: 04/25/2016 12:38   Ct Angio Chest Pe W/cm &/or Wo Cm  Result Date: 04/25/2016 CLINICAL DATA:  Chest pain, leg pain. EXAM: CT ANGIOGRAPHY CHEST WITH CONTRAST TECHNIQUE: Multidetector CT imaging of the chest was performed using the standard protocol during bolus administration of intravenous contrast. Multiplanar CT image reconstructions and MIPs were obtained to evaluate the vascular anatomy. CONTRAST:  100 mL of Isovue 370 intravenously. COMPARISON:  Chest radiograph of same day. FINDINGS: Cardiovascular: There is no evidence of thoracic aortic dissection or aneurysm. Large bilateral pulmonary emboli are noted in the upper and lower lobe branches. RV/LV ratio of 1.5 is noted suggesting right heart strain. Mediastinum/Nodes: No enlarged mediastinal, hilar, or axillary lymph nodes. Thyroid gland, trachea, and esophagus demonstrate no significant findings. Lungs/Pleura: Lungs are clear. No pleural effusion or pneumothorax. Upper Abdomen: No acute abnormality. Musculoskeletal: No chest wall abnormality. No acute or  significant osseous findings. Review of the MIP images confirms the above findings. IMPRESSION: Large bilateral pulmonary emboli are noted. Positive for acute PE with CT evidence of right heart strain (RV/LV Ratio = 1.5) consistent with at least submassive (intermediate risk) PE. The presence of right heart strain has been associated with an increased risk of morbidity and mortality. Please activate Code PE by paging [phone]. Critical Value/emergent results were called by telephone at the time of interpretation on 04/25/2016 at 1:09 pm to Dr. Dorathy DaftKAYLA ROSE , who verbally acknowledged these results. Electronically Signed   By: Lupita RaiderJames  Green Jr, M.D.   On: 04/25/2016 13:09    2D ECHO: Study Conclusions  - Left ventricle: The cavity size was normal. There was mild focal   basal hypertrophy of the septum. Systolic function was normal.   The estimated ejection fraction was in  the range of 60% to 65%.   Wall motion was normal; there were no regional wall motion   abnormalities. - Aortic valve: There was no stenosis. - Mitral valve: There was trivial regurgitation. - Left atrium: The atrium was mildly dilated. - Right ventricle: The cavity size was normal. Systolic function   was normal. - Right atrium: The atrium was mildly dilated. - Tricuspid valve: Peak RV-RA gradient (S): 42 mm Hg. - Pulmonary arteries: PA peak pressure: 45 mm Hg (S). - Inferior vena cava: The vessel was normal in size. The   respirophasic diameter changes were in the normal range (>= 50%),   consistent with normal central venous pressure.  Impressions:  - Normal LV size with mild focal basal septal hypertrophy. EF   60-65%. Normal RV size and systolic function. Mild pulmonary   hypertension. History of PE noted, RV does not looks   significantly abnormal.  Disposition and Follow-up: Discharge Instructions    Diet - low sodium heart healthy    Complete by:  As directed    Increase activity slowly    Complete by:  As directed        DISPOSITION: home    DISCHARGE FOLLOW-UP Follow-up Information    FRIED, Doris CheadleOBERT L, MD. Go on 05/10/2016.   Specialty:  Family Medicine Why:  Hospital follow up @ 2pm please come @ 145 to update insurance info Contact information: [address] [place] [address] [phone]        Leslye PeerBYRUM,ROBERT S., MD On 05/24/2016.   Specialty:  Pulmonary Disease Why:  at 11:30AM for Follow-up  Contact information: [address] [place] [address] [phone]            Time spent on Discharge: 35mins   Signed:   RAI,RIPUDEEP M.D. [facility] 04/28/2016, 10:51 AM Pager: [phone]

## 2016-04-29 ENCOUNTER — Telehealth: Payer: Self-pay | Admitting: Emergency Medicine

## 2016-04-29 NOTE — Telephone Encounter (Signed)
Spoke with pt's wife, Olegario MessierKathy. States that pt saw RB in the hospital for PE. Pt was started on Xarelto. Olegario MessierKathy states that earlier today the pt complained of slight chest tightness. She denied the pt having any SOB. After talking with her further, it came to light the the pt's chest tightness is related to his heart not his lungs. Olegario MessierKathy states that the pt told her that the "chest tightness" has improved since it first happened this morning. I advised her that I would recommend them going to an urgent care or his PCP to have him checked out. She agreed and was going to do that anyway but wanted to check with us first. Pt has an upcoming appointment with RB on 05/24/16 for a HFU. Marilynne Driversdvised Kathy to call us back if they have any further concerns or questions. She agreed and verbalized understanding.

## 2016-05-24 ENCOUNTER — Ambulatory Visit (INDEPENDENT_AMBULATORY_CARE_PROVIDER_SITE_OTHER): Payer: BLUE CROSS/BLUE SHIELD | Admitting: Emergency Medicine

## 2016-05-24 ENCOUNTER — Encounter: Payer: Self-pay | Admitting: Emergency Medicine

## 2016-05-24 VITALS — BP 118/78 | HR 71 | Ht 70.0 in | Wt 194.0 lb

## 2016-05-24 DIAGNOSIS — I2699 Other pulmonary embolism without acute cor pulmonale: Secondary | ICD-10-CM | POA: Diagnosis not present

## 2016-05-24 NOTE — Progress Notes (Signed)
Subjective:    Patient ID: Ian West, male    DOB: 1956/01/07, 61 y.o.   MRN: [id number]  HPI 61 yo man with little past medical history. I met him when he was admitted to [facility] 04/26/16 for acute respiratory failure. He had sustained massive pulmonary embolism with evolving respiratory and cardiopulmonary failure. Given his instability he was started on pressors and TNKase was pushed. He hemodynamically recovered. Was started on xarelto. He has done very well, has been able to get back to his usual activities. He is at the gym and is working out. Has not had any bleeding from any source.    Review of Systems Feels well. All systems reviewed and no symptoms to report/.   Past Medical History:  Diagnosis Date  . Hernia   . Rectal bleeding    hemorrhoids     Family History  Problem Relation Age of Onset  . Thyroid disease Mother   . Diabetes Father      Social History   Social History  . Marital status: Married    Spouse name: N/A  . Number of children: N/A  . Years of education: N/A   Occupational History  . Not on file.   Social History Main Topics  . Smoking status: Former Smoker    Quit date: 08/11/1993  . Smokeless tobacco: Never Used  . Alcohol use Yes     Comment: 2 drinks per week  . Drug use: No  . Sexual activity: Not on file   Other Topics Concern  . Not on file   Social History Narrative  . No narrative on file     Allergies  Allergen Reactions  . Pollen Extract-Tree Extract Itching    Seasonal allergy. Includes grass.     Outpatient Medications Prior to Visit  Medication Sig Dispense Refill  . acidophilus (RISAQUAD) CAPS capsule Take 1 capsule by mouth daily.    . polycarbophil (FIBERCON) 625 MG tablet Take 625 mg by mouth daily.    . Rivaroxaban 15 & 20 MG TBPK Take as directed on package: Start with one 40m tablet by mouth twice a day with food. On Day 22, 05/19/16, switch to one 227mtablet once a day with food. 51 each 0  . Turmeric (CURCUMIN  95) 500 MG CAPS Take 500 mg by mouth daily.    . Marland Kitchencetaminophen (TYLENOL) 500 MG tablet Take 1,000 mg by mouth every 6 (six) hours as needed for moderate pain.    . rivaroxaban (XARELTO) 20 MG TABS tablet Take 1 tablet (20 mg total) by mouth daily with supper. Start this prescription after the starter pack is complete (Patient not taking: Reported on 05/24/2016) 30 tablet 3   No facility-administered medications prior to visit.         Objective:   Physical Exam Vitals:   05/24/16 1137  BP: 118/78  Pulse: 71  SpO2: 97%  Weight: 194 lb (88 kg)  Height: 5' 10"  (1.778 m)   Gen: Pleasant, well-nourished, in no distress,  normal affect  ENT: No lesions,  mouth clear,  oropharynx clear, no postnasal drip  Neck: No JVD, no stridor  Lungs: No use of accessory muscles, no dullness to percussion, clear without rales or rhonchi  Cardiovascular: RRR, heart sounds normal, no murmur or gallops, trace R LE peripheral edema  Musculoskeletal: No deformities, no cyanosis or clubbing  Neuro: alert, non focal  Skin: Warm, no lesions or rashes     Assessment & Plan:  PE (  pulmonary thromboembolism) ([facility]) Massive PE in setting R LE DVT. He was experiencing cardiopulm collapse and recovered after TNK. Now doing well on xarelto. I gave him recs for travel > frequent ambulation, compression hose. He had R heart strain on TTE, will need a repeat in June to eval for recovery. Should be able to d/c xarelto in June. Will undertake hypercoag eval after he has been off anti-coag for a month.   Baltazar Apo, MD, PhD 05/24/2016, 12:19 PM [facility] [phone] or if no answer [phone]

## 2016-05-24 NOTE — Assessment & Plan Note (Signed)
Massive PE in setting R LE DVT. He was experiencing cardiopulm collapse and recovered after TNK. Now doing well on xarelto. I gave him recs for travel > frequent ambulation, compression hose. He had R heart strain on TTE, will need a repeat in June to eval for recovery. Should be able to d/c xarelto in June. Will undertake hypercoag eval after he has been off anti-coag for a month.

## 2016-05-24 NOTE — Patient Instructions (Signed)
Please continue your Xarelto once a day. We will plan to continue for 6 months total.  We will repeat your echocardiogram in June 2018 to assess right heart function Follow up with Dr Delton CoombesByrum in late June 2018 to review. We will talk about the echo and your xarelto at that time.  Please call for any new breathing symptoms or lower extremity swelling.

## 2016-07-10 ENCOUNTER — Emergency Department (HOSPITAL_BASED_OUTPATIENT_CLINIC_OR_DEPARTMENT_OTHER)
Admission: EM | Admit: 2016-07-10 | Discharge: 2016-07-10 | Disposition: A | Discharge status: Home / Self Care | Payer: BLUE CROSS/BLUE SHIELD | Attending: Emergency Medicine | Admitting: Emergency Medicine

## 2016-07-10 ENCOUNTER — Encounter (HOSPITAL_BASED_OUTPATIENT_CLINIC_OR_DEPARTMENT_OTHER): Payer: Self-pay | Admitting: *Deleted

## 2016-07-10 DIAGNOSIS — R002 Palpitations: Secondary | ICD-10-CM | POA: Diagnosis present

## 2016-07-10 DIAGNOSIS — Z87891 Personal history of nicotine dependence: Secondary | ICD-10-CM | POA: Insufficient documentation

## 2016-07-10 DIAGNOSIS — R110 Nausea: Secondary | ICD-10-CM | POA: Insufficient documentation

## 2016-07-10 DIAGNOSIS — Z86711 Personal history of pulmonary embolism: Secondary | ICD-10-CM | POA: Insufficient documentation

## 2016-07-10 HISTORY — DX: Other pulmonary embolism without acute cor pulmonale: I26.99

## 2016-07-10 HISTORY — DX: Irritable bowel syndrome without diarrhea: K58.9

## 2016-07-10 LAB — COMPREHENSIVE METABOLIC PANEL
ALK PHOS: 59 U/L (ref 38–126)
ALT: 18 U/L (ref 17–63)
ANION GAP: 5 (ref 5–15)
AST: 20 U/L (ref 15–41)
Albumin: 3.9 g/dL (ref 3.5–5.0)
BILIRUBIN TOTAL: 0.6 mg/dL (ref 0.3–1.2)
BUN: 18 mg/dL (ref 6–20)
CALCIUM: 8.8 mg/dL — AB (ref 8.9–10.3)
CO2: 26 mmol/L (ref 22–32)
Chloride: 106 mmol/L (ref 101–111)
Creatinine, Ser: 1.19 mg/dL (ref 0.61–1.24)
GFR calc non Af Amer: >60 mL/min (ref >60)
GLUCOSE: 123 mg/dL — AB (ref 65–99)
Potassium: 4 mmol/L (ref 3.5–5.1)
SODIUM: 137 mmol/L (ref 135–145)
TOTAL PROTEIN: 6.7 g/dL (ref 6.5–8.1)

## 2016-07-10 LAB — CBC WITH DIFFERENTIAL/PLATELET
Basophils Absolute: 0 10*3/uL (ref 0.0–0.1)
Basophils Relative: 1 %
EOS ABS: 0.1 10*3/uL (ref 0.0–0.7)
EOS PCT: 2 %
HEMATOCRIT: 39.3 % (ref 39.0–52.0)
Hemoglobin: 13.5 g/dL (ref 13.0–17.0)
LYMPHS ABS: 0.9 10*3/uL (ref 0.7–4.0)
Lymphocytes Relative: 15 %
MCH: 32.5 pg (ref 26.0–34.0)
MCHC: 34.4 g/dL (ref 30.0–36.0)
MCV: 94.5 fL (ref 78.0–100.0)
MONOS PCT: 9 %
Monocytes Absolute: 0.5 10*3/uL (ref 0.1–1.0)
Neutro Abs: 4.5 10*3/uL (ref 1.7–7.7)
Neutrophils Relative %: 73 %
Platelets: 296 10*3/uL (ref 150–400)
RBC: 4.16 MIL/uL — ABNORMAL LOW (ref 4.22–5.81)
RDW: 12.4 % (ref 11.5–15.5)
WBC: 6.1 10*3/uL (ref 4.0–10.5)

## 2016-07-10 LAB — TROPONIN I: Troponin I: <0.03 ng/mL (ref <0.03)

## 2016-07-10 LAB — D-DIMER, QUANTITATIVE: D-Dimer, Quant: <0.27 ug/mL-FEU (ref 0.00–0.50)

## 2016-07-10 NOTE — Discharge Instructions (Signed)
Continue your medications as before.  Return to the emergency department if you develop high fever, chest pain, difficulty breathing, or other new and concerning symptom.

## 2016-07-10 NOTE — ED Triage Notes (Signed)
Patient states he woke up this morning with a feeling of sob, heart fluttering and abdominal cramping.  States he was diagnosed with PE in Apr 25, 2016 is is concerned.  States he did work in his yard for about six hours yesterday.

## 2016-07-10 NOTE — ED Provider Notes (Signed)
[facility] Provider Note   CSN: [id number] Arrival date & time: 07/10/16  0708     History   Chief Complaint Chief Complaint  Patient presents with  . Palpitations    HPI Ian West is a 61 y.o. male.  Patient is a 61 year old male with past medical history of pulmonary embolism diagnosed in January 2018. This was treated with thrombolytics. He presents today for evaluation of not feeling well and "indigestion". He reports doing yard work yesterday, then going to sleep last night feeling well. He woke up this morning feeling somewhat unsettled and with what he describes as indigestion. He denies any chest pain or difficulty breathing. He denies any fevers, chills, or cough. He feels slightly nauseated, but has not vomited and has had no diarrhea. He denies any leg pain or swelling.   The history is provided by the patient.  Palpitations   This is a new problem. Episode onset: This morning. The problem occurs constantly. The problem has not changed since onset.Associated symptoms include nausea. Pertinent negatives include no fever, no chest pain, no cough and no shortness of breath. He has tried nothing for the symptoms.    Past Medical History:  Diagnosis Date  . Hernia   . IBS (irritable bowel syndrome)   . PE (pulmonary thromboembolism) ([facility])   . Rectal bleeding    hemorrhoids    Patient Active Problem List   Diagnosis Date Noted  . PE (pulmonary thromboembolism) ([facility]) 04/25/2016    Past Surgical History:  Procedure Laterality Date  . HERNIA REPAIR         Home Medications    Prior to Admission medications   Medication Sig Start Date End Date Taking? Authorizing Provider  acetaminophen (TYLENOL) 500 MG tablet Take 1,000 mg by mouth every 6 (six) hours as needed for moderate pain.   Yes Historical Provider, MD  acidophilus (RISAQUAD) CAPS capsule Take 1 capsule by mouth daily.   Yes Historical Provider, MD  polycarbophil (FIBERCON) 625 MG tablet  Take 625 mg by mouth daily.   Yes Historical Provider, MD  rivaroxaban (XARELTO) 20 MG TABS tablet Take 1 tablet (20 mg total) by mouth daily with supper. Start this prescription after the starter pack is complete 05/19/16  Yes Ripudeep Jenna Luo, MD  Rivaroxaban 15 & 20 MG TBPK Take as directed on package: Start with one 15mg  tablet by mouth twice a day with food. On Day 22, 05/19/16, switch to one 20mg  tablet once a day with food. 04/28/16   Ripudeep Jenna Luo, MD  Turmeric (CURCUMIN 95) 500 MG CAPS Take 500 mg by mouth daily.    Historical Provider, MD    Family History Family History  Problem Relation Age of Onset  . Thyroid disease Mother   . Diabetes Father     Social History Social History  Substance Use Topics  . Smoking status: Former Smoker    Quit date: 08/11/1993  . Smokeless tobacco: Never Used  . Alcohol use Yes     Comment: 2 drinks per week     Allergies   Pollen extract-tree extract   Review of Systems Review of Systems  Constitutional: Negative for fever.  Respiratory: Negative for cough and shortness of breath.   Cardiovascular: Positive for palpitations. Negative for chest pain.  Gastrointestinal: Positive for nausea.  All other systems reviewed and are negative.    Physical Exam Updated Vital Signs BP 129/71 (BP Location: Right Arm)   Pulse 90   Temp 98.6  F (37 C) (Oral)   Resp 16   Ht 5\' 10"  (1.778 m)   Wt 190 lb (86.2 kg)   SpO2 97%   BMI 27.26 kg/m   Physical Exam  Constitutional: He is oriented to person, place, and time. He appears well-developed and well-nourished. No distress.  HENT:  Head: Normocephalic and atraumatic.  Mouth/Throat: Oropharynx is clear and moist.  Neck: Normal range of motion. Neck supple.  Cardiovascular: Normal rate and regular rhythm.  Exam reveals no friction rub.   No murmur heard. Pulmonary/Chest: Effort normal and breath sounds normal. No respiratory distress. He has no wheezes. He has no rales.  Abdominal: Soft.  Bowel sounds are normal. He exhibits no distension. There is no tenderness.  Musculoskeletal: Normal range of motion. He exhibits no edema.  Neurological: He is alert and oriented to person, place, and time. Coordination normal.  Skin: Skin is warm and dry. He is not diaphoretic.  Nursing note and vitals reviewed.    ED Treatments / Results  Labs (all labs ordered are listed, but only abnormal results are displayed) Labs Reviewed  COMPREHENSIVE METABOLIC PANEL  TROPONIN I  CBC WITH DIFFERENTIAL/PLATELET  D-DIMER, QUANTITATIVE (NOT AT [facility])    EKG  EKG Interpretation  Date/Time:  Sunday July 10 2016 07:22:26 EDT Ventricular Rate:  92 PR Interval:    QRS Duration: 92 QT Interval:  365 QTC Calculation: 452 R Axis:   72 Text Interpretation:  Sinus rhythm Low voltage, precordial leads Confirmed by Trevell Pariseau  MD, Alonia Dibuono ([id number]) on 07/10/2016 7:26:43 AM       Radiology No results found.  Procedures Procedures (including critical care time)  Medications Ordered in ED Medications - No data to display   Initial Impression / Assessment and Plan / ED Course  I have reviewed the triage vital signs and the nursing notes.  Pertinent labs & imaging results that were available during my care of the patient were reviewed by me and considered in my medical decision making (see chart for details).  Patient is a 61 year old male who presents with complaints of "not feeling well". He had recent pulmonary embolism and this made him come in today to make sure that everything is okay. His EKG is unchanged, troponin is negative, and d-dimer is also negative.  His wife reports that she is also having similar symptoms, so I suspect that this may be some sort of viral illness. Nothing in today's workup appears emergent. I highly doubt recurrent pulmonary embolism given the symptoms, negative d-dimer. I also doubt a cardiac etiology as the patient has no prior cardiac history, no risk factors, and  negative workup.  He will be discharged, to return as needed if his symptoms worsen or change.  Final Clinical Impressions(s) / ED Diagnoses   Final diagnoses:  None    New Prescriptions New Prescriptions   No medications on file     Geoffery Lyonsouglas Bronson Bressman, MD 07/10/16 [phone]

## 2016-10-10 ENCOUNTER — Other Ambulatory Visit: Payer: Self-pay

## 2016-10-10 ENCOUNTER — Ambulatory Visit (HOSPITAL_COMMUNITY): Payer: BLUE CROSS/BLUE SHIELD | Attending: Cardiology

## 2016-10-10 DIAGNOSIS — I2699 Other pulmonary embolism without acute cor pulmonale: Secondary | ICD-10-CM | POA: Diagnosis not present

## 2016-10-10 DIAGNOSIS — I340 Nonrheumatic mitral (valve) insufficiency: Secondary | ICD-10-CM | POA: Insufficient documentation

## 2016-10-19 ENCOUNTER — Encounter: Payer: Self-pay | Admitting: Emergency Medicine

## 2016-10-19 ENCOUNTER — Other Ambulatory Visit: Payer: BLUE CROSS/BLUE SHIELD

## 2016-10-19 ENCOUNTER — Ambulatory Visit (INDEPENDENT_AMBULATORY_CARE_PROVIDER_SITE_OTHER): Payer: BLUE CROSS/BLUE SHIELD | Admitting: Emergency Medicine

## 2016-10-19 VITALS — BP 112/70 | HR 64 | Ht 70.0 in | Wt 201.0 lb

## 2016-10-19 DIAGNOSIS — I2699 Other pulmonary embolism without acute cor pulmonale: Secondary | ICD-10-CM | POA: Diagnosis not present

## 2016-10-19 NOTE — Patient Instructions (Signed)
We will not restart Xarelto.  We will perform lab work to gauge risk for a recurrent blood clot.  Your echo cardiogram shows that your pulmonary vascular pressures have normalized.  We will call you to review your labs.  Follow with Dr Delton CoombesByrum if needed for any problems with breathing.

## 2016-10-19 NOTE — Progress Notes (Signed)
Subjective:    Patient ID: Ian West, male    DOB: 10/01/1955, 61 y.o.   MRN: [id number]  HPI 61 yo man with little past medical history. I met him when he was admitted to [facility] 04/26/16 for acute respiratory failure. He had sustained massive pulmonary embolism with evolving respiratory and cardiopulmonary failure. Given his instability he was started on pressors and TNKase was pushed. He hemodynamically recovered. Was started on xarelto. He has done very well, has been able to get back to his usual activities. He is at the gym and is working out. Has not had any bleeding from any source.   ROV 10/19/16 -- Patient has a history of massive pulmonary embolism with cardiopulmonary failure for which he underwent TNKase treatment and subsequently anticoagulation with Xarelto. He has now finished the Xarelto in late May. A repeat echocardiogram was performed on 10/10/16 which showed mild LV dilation, normal systolic function with EF 55-60%. Mild right ventricular dilation with normal wall thickness, normal PA pressures (peak 25mHg). PASP is down from 45 mmHg 04/26/16.  He has no limitations, good exercise tolerance. No chest pain. No evidence of lower extremity DVT   Review of Systems Feels well. All systems reviewed and no symptoms to report/.   Past Medical History:  Diagnosis Date  . Hernia   . IBS (irritable bowel syndrome)   . PE (pulmonary thromboembolism) ([place]   . Rectal bleeding    hemorrhoids     Family History  Problem Relation Age of Onset  . Thyroid disease Mother   . Diabetes Father      Social History   Social History  . Marital status: Married    Spouse name: N/A  . Number of children: N/A  . Years of education: N/A   Occupational History  . Not on file.   Social History Main Topics  . Smoking status: Former Smoker    Quit date: 08/11/1993  . Smokeless tobacco: Never Used  . Alcohol use Yes     Comment: 2 drinks per week  . Drug use: No  . Sexual activity: Not on  file   Other Topics Concern  . Not on file   Social History Narrative  . No narrative on file     Allergies  Allergen Reactions  . Pollen Extract-Tree Extract Itching    Seasonal allergy. Includes grass.     Outpatient Medications Prior to Visit  Medication Sig Dispense Refill  . acetaminophen (TYLENOL) 500 MG tablet Take 1,000 mg by mouth every 6 (six) hours as needed for moderate pain.    .Marland Kitchenacidophilus (RISAQUAD) CAPS capsule Take 1 capsule by mouth daily.    . polycarbophil (FIBERCON) 625 MG tablet Take 625 mg by mouth daily.    . Turmeric (CURCUMIN 95) 500 MG CAPS Take 500 mg by mouth daily.    . rivaroxaban (XARELTO) 20 MG TABS tablet Take 1 tablet (20 mg total) by mouth daily with supper. Start this prescription after the starter pack is complete 30 tablet 3  . Rivaroxaban 15 & 20 MG TBPK Take as directed on package: Start with one 184mtablet by mouth twice a day with food. On Day 22, 05/19/16, switch to one 2086mablet once a day with food. 51 each 0   No facility-administered medications prior to visit.         Objective:   Physical Exam Vitals:   10/19/16 1326  BP: 112/70  Pulse: 64  SpO2: 98%  Weight: 201 lb (  91.2 kg)  Height: 5' 10"  (1.778 m)   Gen: Pleasant, well-nourished, in no distress,  normal affect  ENT: No lesions,  mouth clear,  oropharynx clear, no postnasal drip  Neck: No JVD, no stridor  Lungs: No use of accessory muscles, no dullness to percussion, clear without rales or rhonchi  Cardiovascular: RRR, heart sounds normal, no murmur or gallops, no LE edema  Musculoskeletal: No deformities, no cyanosis or clubbing  Neuro: alert, non focal  Skin: Warm, no lesions or rashes     Assessment & Plan:  PE (pulmonary thromboembolism) (HCC) Massive pulmonary embolism, now back to his baseline functional capacity. He denies any symptoms. No evidence of a lower extremity DVT. He has been off Xarelto for almost one month so I would like to check  hypercoagulability labs now. If any abnormalities that I will refer him to hematology to decide whether he needs longer term anticoagulation.  We will not restart Xarelto.  We will perform lab work to gauge risk for a recurrent blood clot.  Your echo cardiogram shows that your pulmonary vascular pressures have normalized.  We will call you to review your labs.  Follow with Dr Lamonte Sakai if needed for any problems with breathing.   Baltazar Apo, MD, PhD 10/19/2016, 1:49 PM [facility] [phone] or if no answer [phone]

## 2016-10-19 NOTE — Assessment & Plan Note (Signed)
Massive pulmonary embolism, now back to his baseline functional capacity. He denies any symptoms. No evidence of a lower extremity DVT. He has been off Xarelto for almost one month so I would like to check hypercoagulability labs now. If any abnormalities that I will refer him to hematology to decide whether he needs longer term anticoagulation.  We will not restart Xarelto.  We will perform lab work to gauge risk for a recurrent blood clot.  Your echo cardiogram shows that your pulmonary vascular pressures have normalized.  We will call you to review your labs.  Follow with Dr Delton CoombesByrum if needed for any problems with breathing.

## 2016-10-22 LAB — RFX DRVVT SCR W/RFLX CONF 1:1 MIX: dRVVT Screen: 33 s (ref <=45)

## 2016-10-22 LAB — RFX PTT-LA W/RFX TO HEX PHASE CONF: PTT-LA SCREEN: 27 s (ref <=40)

## 2016-10-24 LAB — HYPERCOAGULABLE PANEL, COMPREHENSIVE
ANTITHROMB III FUNC: 92 %{activity} (ref 80–120)
Anticardiolipin IgG: <14 [GPL'U]
Beta-2 Glyco I IgG: <9 SGU (ref <=20)
Beta-2-Glycoprotein I IgM: <9 SMU (ref <=20)
PROTEIN S ANTIGEN, TOTAL: 113 % (ref 70–140)
Protein C Activity: 132 % (ref 70–180)
Protein C Antigen: 119 % (ref 70–140)
Protein S Activity: 96 % (ref 70–150)

## 2016-11-03 ENCOUNTER — Telehealth: Payer: Self-pay | Admitting: Emergency Medicine

## 2016-11-03 NOTE — Telephone Encounter (Signed)
Spoke with pt. He is requesting his lab results from 10/19/2016.  RB - please advise. Thanks.

## 2016-11-04 NOTE — Telephone Encounter (Signed)
Spoke with pt. He is aware of his lab results. OV has been scheduled for 11/18/2016 at 2pm. Nothing further was needed.

## 2016-11-04 NOTE — Telephone Encounter (Signed)
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 This increases his chances for a blood clot 3-8x above the standard population. Because he has had a dangerous clot before, I believe we need to follow up in office to discuss the pro's and con's of being on a blood thinner for longer, and to talk about whether there is any benefit to testing family so that they know their own risk profiles for future consideration.   Please have him set up an OV.

## 2016-11-18 ENCOUNTER — Encounter: Payer: Self-pay | Admitting: Emergency Medicine

## 2016-11-18 ENCOUNTER — Ambulatory Visit (INDEPENDENT_AMBULATORY_CARE_PROVIDER_SITE_OTHER): Payer: BLUE CROSS/BLUE SHIELD | Admitting: Emergency Medicine

## 2016-11-18 DIAGNOSIS — I2699 Other pulmonary embolism without acute cor pulmonale: Secondary | ICD-10-CM | POA: Diagnosis not present

## 2016-11-18 NOTE — Patient Instructions (Addendum)
Your blood work shows a single mutation in one of your clotting factors called Factor V Leiden mutation. This slightly changes your risk for blood clots. You should discuss this with your first degree relatives, have them consider testing.  Based on your clinical history and your lab work we do not need to keep you on anticoagulation for an extended period.  If you have a recurrent blood clot at any time then you will need to be on therapeutic anticoagulation lifelong.  Follow with Dr Delton CoombesByrum as needed for any new problems or questions.

## 2016-11-18 NOTE — Assessment & Plan Note (Signed)
Discussed case with Dr Cyndie ChimeGranfortuna. If this were an unprovoked clot then he would require anticoagulation for life. Because this was provoked by airline travel I believe that we can defer that for now. Certainly if he has another clot he would need anticoagulation for good. He is a factor V Leiden heterozygote. I discussed this with him. It should not change our overall management but it does change his risk. I also asked him to discuss this with his family.  Your blood work shows a single mutation in one of your clotting factors called Factor V Leiden mutation. This slightly changes your risk for blood clots. You should discuss this with your first degree relatives, have them consider testing.  Based on your clinical history and your lab work we do not need to keep you on anticoagulation for an extended period.  If you have a recurrent blood clot at any time then you will need to be on therapeutic anticoagulation lifelong.  Follow with Dr Delton CoombesByrum as needed for any new problems or questions.

## 2016-11-18 NOTE — Progress Notes (Signed)
Subjective:    Patient ID: Ian West, male    DOB: [birth year], 61 y.o.   MRN: [id number]  HPI 61 yo man with little past medical history. I met him when he was admitted to [facility] 04/26/16 for acute respiratory failure. He had sustained massive pulmonary embolism with evolving respiratory and cardiopulmonary failure. Given his instability he was started on pressors and TNKase was pushed. He hemodynamically recovered. Was started on xarelto. He has done very well, has been able to get back to his usual activities. He is at the gym and is working out. Has not had any bleeding from any source.   ROV 10/19/16 -- Patient has a history of massive pulmonary embolism with cardiopulmonary failure for which he underwent TNKase treatment and subsequently anticoagulation with Xarelto. He has now finished the Xarelto in late May. A repeat echocardiogram was performed on 10/10/16 which showed mild LV dilation, normal systolic function with EF 55-60%. Mild right ventricular dilation with normal wall thickness, normal PA pressures (peak 54mHg). PASP is down from 45 mmHg 04/26/16.  He has no limitations, good exercise tolerance. No chest pain. No evidence of lower extremity DVT  ROV 11/18/16 -- Is a follow-up visit for patient apparently unprovoked massive pulmonary embolism for which he was treated with anticoagulation successfully. He had improvement in his pulmonary arterial pressures and follow-up echocardiogram as above. Last time we performed a hypercoagulable panel. This was normal with the exception of newly identified heterozygosity of factor V Leiden. This may have ben a provoked clot > many airline trips and car trips, including a trip 2 weeks before his PE. He had some R LE pain 2 weeks ago > doppler study negative.     Review of Systems Feels well. All systems reviewed and no symptoms to report/.   Past Medical History:  Diagnosis Date  . Hernia   . IBS (irritable bowel syndrome)   . PE (pulmonary  thromboembolism) ([place]   . Rectal bleeding    hemorrhoids     Family History  Problem Relation Age of Onset  . Thyroid disease Mother   . Diabetes Father      Social History   Social History  . Marital status: Married    Spouse name: N/A  . Number of children: N/A  . Years of education: N/A   Occupational History  . Not on file.   Social History Main Topics  . Smoking status: Former Smoker    Quit date: 08/11/1993  . Smokeless tobacco: Never Used  . Alcohol use Yes     Comment: 2 drinks per week  . Drug use: No  . Sexual activity: Not on file   Other Topics Concern  . Not on file   Social History Narrative  . No narrative on file     Allergies  Allergen Reactions  . Pollen Extract-Tree Extract Itching    Seasonal allergy. Includes grass.     Outpatient Medications Prior to Visit  Medication Sig Dispense Refill  . acetaminophen (TYLENOL) 500 MG tablet Take 1,000 mg by mouth every 6 (six) hours as needed for moderate pain.    .Marland Kitchenacidophilus (RISAQUAD) CAPS capsule Take 1 capsule by mouth daily.    . polycarbophil (FIBERCON) 625 MG tablet Take 625 mg by mouth daily.    . Turmeric (CURCUMIN 95) 500 MG CAPS Take 500 mg by mouth daily.     No facility-administered medications prior to visit.         Objective:  Physical Exam Vitals:   11/18/16 1400 11/18/16 1401  BP:  120/76  Pulse:  82  SpO2:  98%  Weight: 206 lb (93.4 kg)   Height: 5' 10"  (1.778 m)    Gen: Pleasant, well-nourished, in no distress,  normal affect  ENT: No lesions,  mouth clear,  oropharynx clear, no postnasal drip  Neck: No JVD, no stridor  Lungs: No use of accessory muscles, no dullness to percussion, clear without rales or rhonchi  Cardiovascular: RRR, heart sounds normal, no murmur or gallops, no LE edema  Musculoskeletal: No deformities, no cyanosis or clubbing  Neuro: alert, non focal  Skin: Warm, no lesions or rashes     Assessment & Plan:  PE (pulmonary  thromboembolism) ([place]) Discussed case with Dr Beryle Beams. If this were an unprovoked clot then he would require anticoagulation for life. Because this was provoked by airline travel I believe that we can defer that for now. Certainly if he has another clot he would need anticoagulation for good. He is a factor V Leiden heterozygote. I discussed this with him. It should not change our overall management but it does change his risk. I also asked him to discuss this with his family.  Your blood work shows a single mutation in one of your clotting factors called Factor V Leiden mutation. This slightly changes your risk for blood clots. You should discuss this with your first degree relatives, have them consider testing.  Based on your clinical history and your lab work we do not need to keep you on anticoagulation for an extended period.  If you have a recurrent blood clot at any time then you will need to be on therapeutic anticoagulation lifelong.  Follow with Dr Lamonte Sakai as needed for any new problems or questions.    Baltazar Apo, MD, PhD 11/18/2016, 2:35 PM  Pulmonary and Critical Care [phone] or if no answer [phone]

## 2017-05-11 IMAGING — CT CT ANGIO CHEST
2 of 6 series · 18 of 36 positions shown · IV contrast (Omni 300)
Comparison: Chest radiograph of same day.

CLINICAL DATA: Chest pain, leg pain.

EXAM:
CT ANGIOGRAPHY CHEST WITH CONTRAST
TECHNIQUE: Multidetector CT imaging of the chest was performed using the
standard protocol during bolus administration of intravenous
contrast. Multiplanar CT image reconstructions and MIPs were
obtained to evaluate the vascular anatomy.
CONTRAST:  100 mL of Isovue 370 intravenously.

[Series 6: pe thins · axial · 0.65mm/px · z∈[+1092,+1334]mm · 17 of 274 slices shown]
[im 16/274  lung]
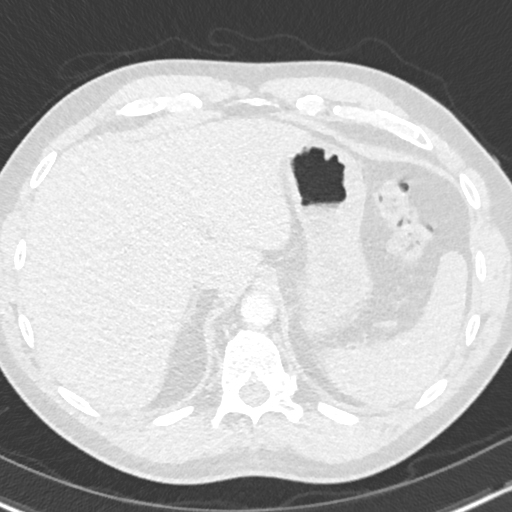
[im 31/274  mediastinal]
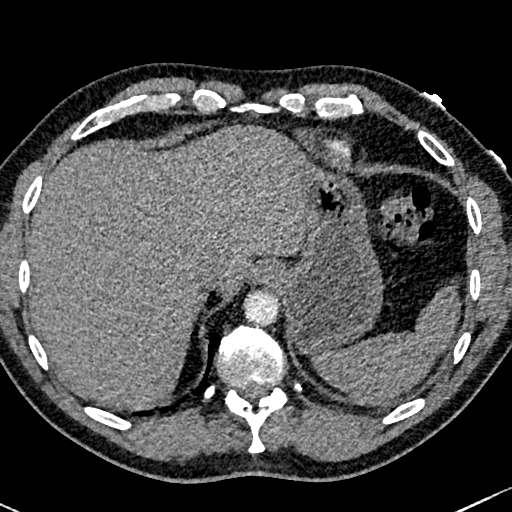
[im 46/274  lung]
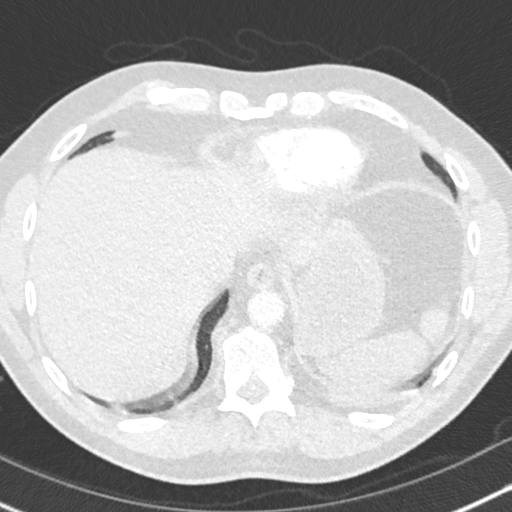
[im 61/274  mediastinal]
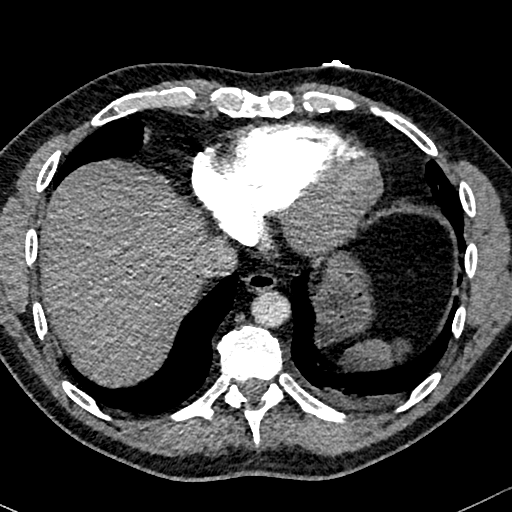
[im 76/274  lung]
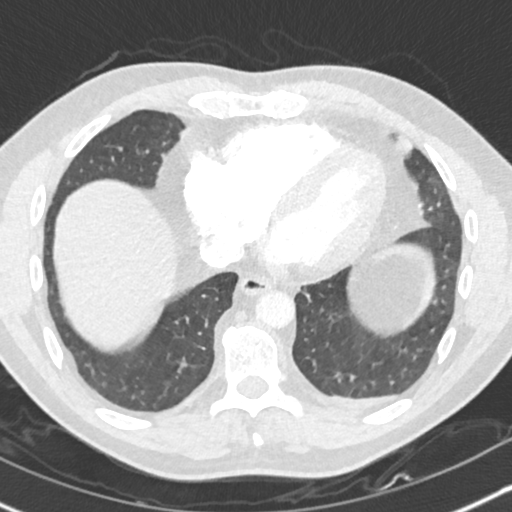
[im 92/274  mediastinal]
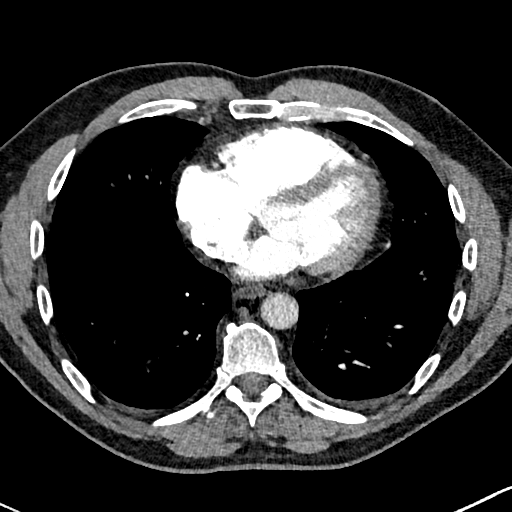
[im 107/274  lung]
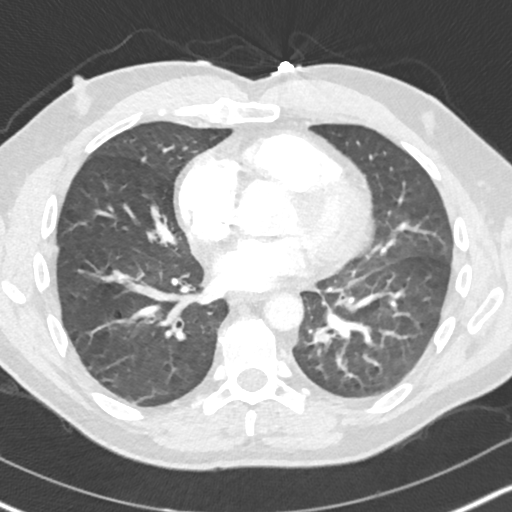
[im 122/274  mediastinal]
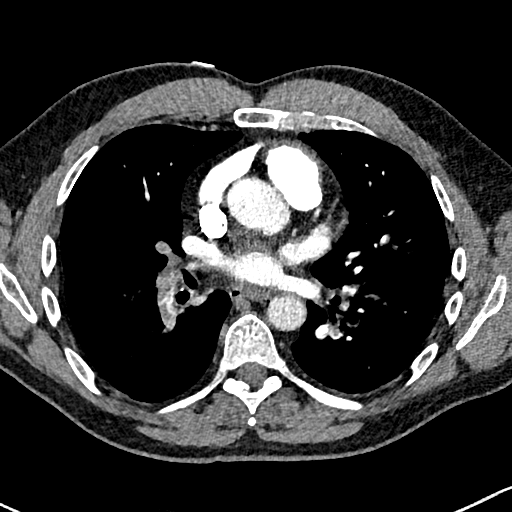
[im 137/274  lung]
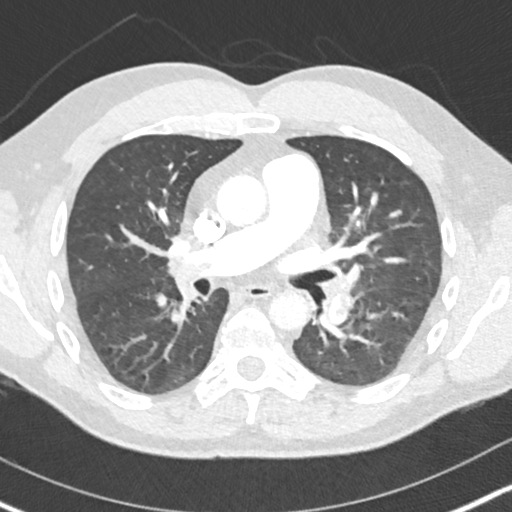
[im 152/274  mediastinal]
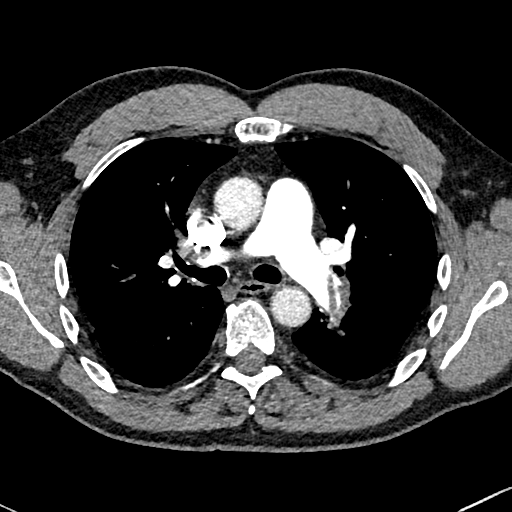
[im 167/274  lung]
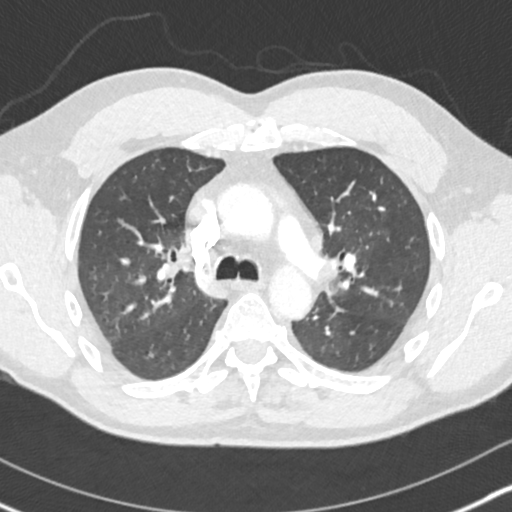
[im 183/274  mediastinal]
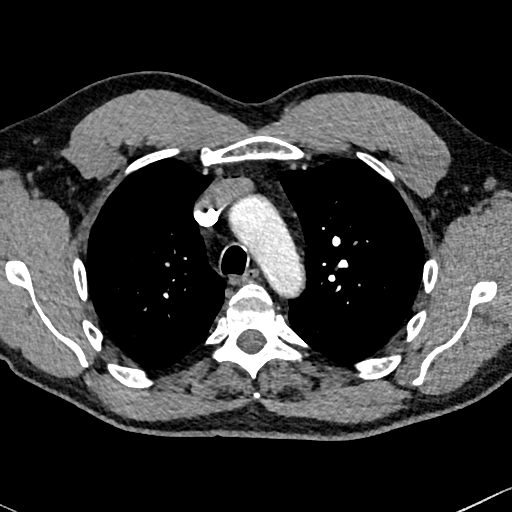
[im 198/274  lung]
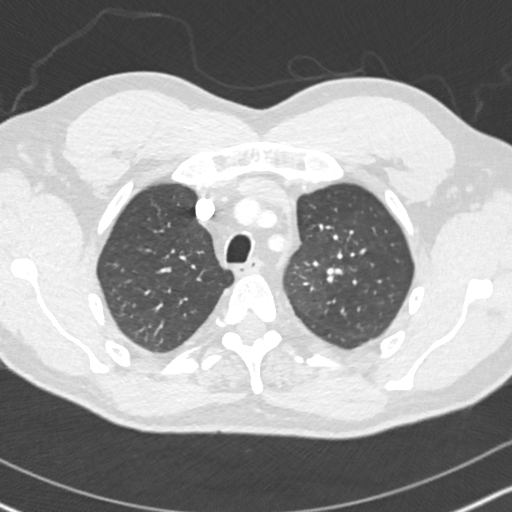
[im 213/274  mediastinal]
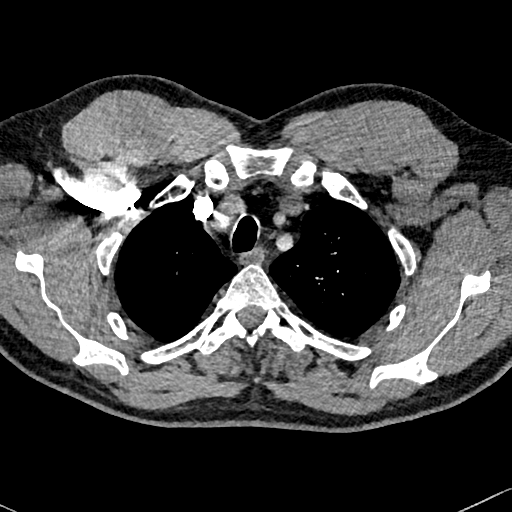
[im 228/274  lung]
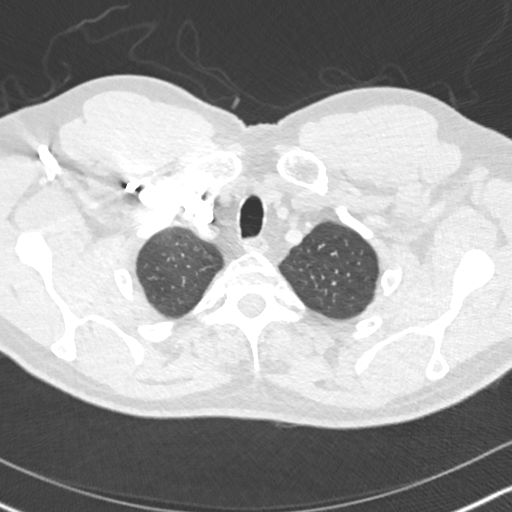
[im 243/274  mediastinal]
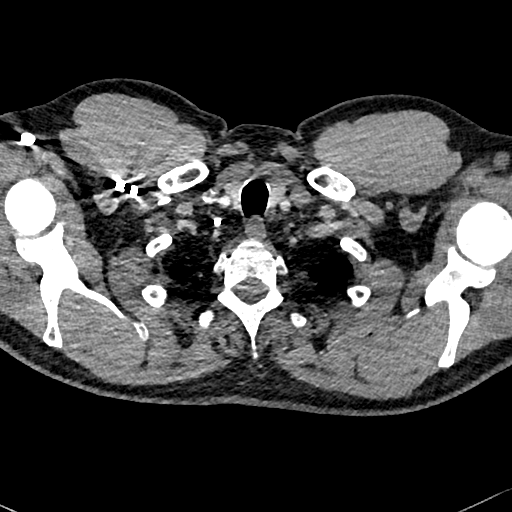
[im 258/274  lung]
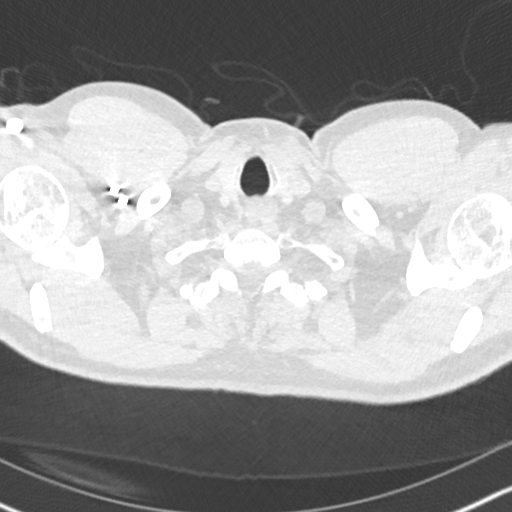

[Series 7: pe 2mm cor · coronal · 0.59mm/px · 1 of 150 slices shown]
[im 75/150  mediastinal]
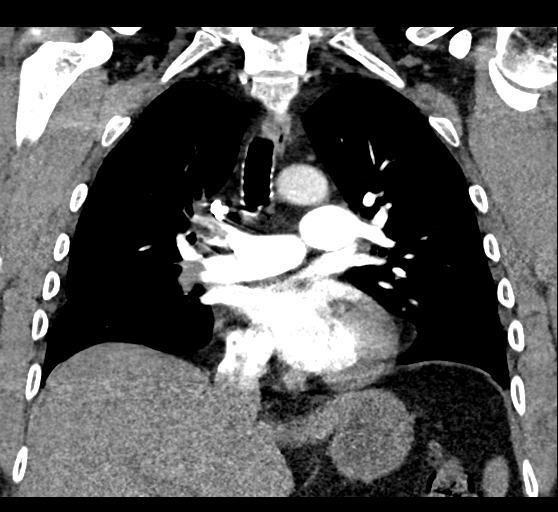

[18 of 36 positions shown; findings below may reference images not displayed]

FINDINGS: Cardiovascular: There is no evidence of thoracic aortic dissection
or aneurysm. Large bilateral pulmonary emboli are noted in the upper
and lower lobe branches. RV/LV ratio of 1.5 is noted suggesting
right heart strain.

Mediastinum/Nodes: No enlarged mediastinal, hilar, or axillary lymph
nodes. Thyroid gland, trachea, and esophagus demonstrate no
significant findings.

Lungs/Pleura: Lungs are clear. No pleural effusion or pneumothorax.

Upper Abdomen: No acute abnormality.

Musculoskeletal: No chest wall abnormality. No acute or significant
osseous findings.

Review of the MIP images confirms the above findings.
IMPRESSION: Large bilateral pulmonary emboli are noted. Positive for acute PE
with CT evidence of right heart strain (RV/LV Ratio = 1.5)
consistent with at least submassive (intermediate risk) PE. The
presence of right heart strain has been associated with an increased
risk of morbidity and mortality. Please activate Code PE by paging
[phone]. Critical Value/emergent results were called by
telephone at the time of interpretation on 04/25/2016 at [DATE] to
Dr. [employer] , who verbally acknowledged these results.

## 2018-01-09 ENCOUNTER — Telehealth: Payer: Self-pay | Admitting: Pharmacy Technician

## 2018-01-09 ENCOUNTER — Telehealth: Payer: Self-pay | Admitting: Emergency Medicine

## 2018-01-09 NOTE — Telephone Encounter (Signed)
Called and spoke with Waynetta SandyBeth at [facility], patient is having emergency surgery and needs OV notes. Notes printed and faxed. Nothing further needed at this time.

## 2018-01-10 NOTE — Telephone Encounter (Signed)
A user error has taken place.
